# Patient Record
Sex: Male | Born: 1950 | Race: Black or African American | Hispanic: No | Smoking: Former smoker
Health system: Southern US, Community
[De-identification: ages and names within clinical notes are randomized; demographics above are authoritative.]

## PROBLEM LIST (undated history)

## (undated) DIAGNOSIS — E7800 Pure hypercholesterolemia, unspecified: Secondary | ICD-10-CM

## (undated) DIAGNOSIS — I10 Essential (primary) hypertension: Secondary | ICD-10-CM

## (undated) HISTORY — PX: BACK SURGERY: SHX140

---

## 1999-11-27 ENCOUNTER — Ambulatory Visit (HOSPITAL_BASED_OUTPATIENT_CLINIC_OR_DEPARTMENT_OTHER): Admission: RE | Admit: 1999-11-27 | Discharge: 1999-11-27 | Payer: Self-pay | Admitting: Otolaryngology

## 2021-01-20 ENCOUNTER — Emergency Department (HOSPITAL_BASED_OUTPATIENT_CLINIC_OR_DEPARTMENT_OTHER)
Admission: EM | Admit: 2021-01-20 | Discharge: 2021-01-20 | Disposition: A | Discharge status: Home / Self Care | Payer: Medicare HMO | Attending: Emergency Medicine | Admitting: Emergency Medicine

## 2021-01-20 ENCOUNTER — Other Ambulatory Visit: Payer: Self-pay

## 2021-01-20 ENCOUNTER — Emergency Department (HOSPITAL_BASED_OUTPATIENT_CLINIC_OR_DEPARTMENT_OTHER): Payer: Medicare HMO

## 2021-01-20 ENCOUNTER — Encounter (HOSPITAL_BASED_OUTPATIENT_CLINIC_OR_DEPARTMENT_OTHER): Payer: Self-pay

## 2021-01-20 DIAGNOSIS — M79605 Pain in left leg: Secondary | ICD-10-CM | POA: Diagnosis not present

## 2021-01-20 DIAGNOSIS — R42 Dizziness and giddiness: Secondary | ICD-10-CM | POA: Diagnosis present

## 2021-01-20 DIAGNOSIS — I10 Essential (primary) hypertension: Secondary | ICD-10-CM | POA: Diagnosis not present

## 2021-01-20 DIAGNOSIS — F17210 Nicotine dependence, cigarettes, uncomplicated: Secondary | ICD-10-CM | POA: Insufficient documentation

## 2021-01-20 DIAGNOSIS — E860 Dehydration: Secondary | ICD-10-CM | POA: Insufficient documentation

## 2021-01-20 HISTORY — DX: Pure hypercholesterolemia, unspecified: E78.00

## 2021-01-20 HISTORY — DX: Essential (primary) hypertension: I10

## 2021-01-20 LAB — CBC WITH DIFFERENTIAL/PLATELET
Abs Immature Granulocytes: 0.04 10*3/uL (ref 0.00–0.07)
Basophils Absolute: 0.1 10*3/uL (ref 0.0–0.1)
Basophils Relative: 1 %
Eosinophils Absolute: 0.2 10*3/uL (ref 0.0–0.5)
Eosinophils Relative: 1 %
HCT: 42.1 % (ref 39.0–52.0)
Hemoglobin: 14 g/dL (ref 13.0–17.0)
Immature Granulocytes: 0 %
Lymphocytes Relative: 27 %
Lymphs Abs: 3.6 10*3/uL (ref 0.7–4.0)
MCH: 28.3 pg (ref 26.0–34.0)
MCHC: 33.3 g/dL (ref 30.0–36.0)
MCV: 85.1 fL (ref 80.0–100.0)
Monocytes Absolute: 0.9 10*3/uL (ref 0.1–1.0)
Monocytes Relative: 7 %
Neutro Abs: 8.5 10*3/uL — ABNORMAL HIGH (ref 1.7–7.7)
Neutrophils Relative %: 64 %
Platelets: 308 10*3/uL (ref 150–400)
RBC: 4.95 MIL/uL (ref 4.22–5.81)
RDW: 15.6 % — ABNORMAL HIGH (ref 11.5–15.5)
WBC: 13.4 10*3/uL — ABNORMAL HIGH (ref 4.0–10.5)
nRBC: 0 % (ref 0.0–0.2)

## 2021-01-20 LAB — BASIC METABOLIC PANEL
Anion gap: 6 (ref 5–15)
BUN: 10 mg/dL (ref 8–23)
CO2: 25 mmol/L (ref 22–32)
Calcium: 9.1 mg/dL (ref 8.9–10.3)
Chloride: 107 mmol/L (ref 98–111)
Creatinine, Ser: 1.08 mg/dL (ref 0.61–1.24)
GFR, Estimated: >60 mL/min (ref >60)
Glucose, Bld: 91 mg/dL (ref 70–99)
Potassium: 3.9 mmol/L (ref 3.5–5.1)
Sodium: 138 mmol/L (ref 135–145)

## 2021-01-20 LAB — PROTIME-INR
INR: 1 (ref 0.8–1.2)
Prothrombin Time: 13.1 seconds (ref 11.4–15.2)

## 2021-01-20 LAB — CBG MONITORING, ED: Glucose-Capillary: 85 mg/dL (ref 70–99)

## 2021-01-20 MED ORDER — SODIUM CHLORIDE 0.9 % IV BOLUS
1000.0000 mL | Freq: Once | INTRAVENOUS | Status: AC
  Administered 2021-01-20: 1000 mL via INTRAVENOUS

## 2021-01-20 NOTE — ED Provider Notes (Signed)
[facility] EMERGENCY DEPARTMENT Provider Note   CSN: [id number] Arrival date & time: 01/20/21  1256     History Chief Complaint  Patient presents with   Dizziness    Mark Bird is a 70 y.o. male.  HPI Patient is a 70 year old male with past medical history of HLD, HTN, stroke, chronic back pain and s/p back surgery.  Patient is presented today with daughter at bedside.  He is complaining of symptoms of lightheadedness.  He denies any vertiginous symptoms.  States that he feels his symptoms primarily when he stands up.  States that he felt all day yesterday as well as all day today.  Denies any chest pain, headache, cough congestion fevers chills belly pain nausea or vomiting.  No diarrhea.  He states that he was recently out of his amlodipine for 3 days and restarted yesterday around the time when he started feeling weak.  He also states that on Thursday/2 days ago he did some outdoor work/moving for his daughter and may have overexerted himself.  He denies any weakness or numbness in any extremity.  He states he has some left leg pain but he states that he always has that pain.  He denies any new symptoms besides the lightheadedness and states he has taken no new medications no recreational drug use no alcohol.      Past Medical History:  Diagnosis Date   High cholesterol    Hypertension     There are no problems to display for this patient.   Past Surgical History:  Procedure Laterality Date   BACK SURGERY         History reviewed. No pertinent family history.  Social History   Tobacco Use   Smoking status: Some Days    Pack years: 0.00    Types: Cigarettes   Smokeless tobacco: Never  Substance Use Topics   Alcohol use: Not Currently   Drug use: Not Currently    Home Medications Prior to Admission medications   Not on File    Allergies    Patient has no known allergies.  Review of Systems   Review of Systems  Constitutional:  Negative  for chills and fever.  HENT:  Negative for congestion.   Eyes:  Negative for pain.  Respiratory:  Negative for cough and shortness of breath.   Cardiovascular:  Negative for chest pain and leg swelling.  Gastrointestinal:  Negative for abdominal distention, abdominal pain, diarrhea, nausea and vomiting.  Genitourinary:  Negative for dysuria.  Musculoskeletal:  Negative for myalgias.  Skin:  Negative for rash.  Neurological:  Positive for light-headedness. Negative for dizziness and headaches.   Physical Exam Updated Vital Signs BP 133/80   Pulse 73   Temp 98.7 F (37.1 C) (Oral)   Resp 12   Ht 5\' 8"  (1.727 m)   Wt 65.8 kg   SpO2 100%   BMI 22.05 kg/m   Physical Exam Vitals and nursing note reviewed.  Constitutional:      General: He is not in acute distress. HENT:     Head: Normocephalic and atraumatic.     Nose: Nose normal.     Mouth/Throat:     Mouth: Mucous membranes are dry.  Eyes:     General: No scleral icterus. Cardiovascular:     Rate and Rhythm: Normal rate and regular rhythm.     Pulses: Normal pulses.     Heart sounds: Normal heart sounds.  Pulmonary:     Effort: Pulmonary effort  is normal. No respiratory distress.     Breath sounds: No wheezing.  Abdominal:     Palpations: Abdomen is soft.     Tenderness: There is no abdominal tenderness.  Musculoskeletal:     Cervical back: Normal range of motion.     Right lower leg: No edema.     Left lower leg: No edema.  Skin:    General: Skin is warm and dry.     Capillary Refill: Capillary refill takes less than 2 seconds.  Neurological:     Mental Status: He is alert. Mental status is at baseline.     Comments: Alert and oriented to self, place, time and event.   Speech is fluent, clear without dysarthria or dysphasia.   Strength 5/5 in upper/lower extremities   Sensation intact in upper/lower extremities   Normal gait although hesitant  Normal finger-to-nose and feet tapping.  CN I not tested  CN  II grossly intact visual fields bilaterally. Did not visualize posterior eye.  CN III, IV, VI PERRLA and EOMs intact bilaterally  CN V Intact sensation to sharp and light touch to the face  CN VII facial movements symmetric  CN VIII not tested  CN IX, X no uvula deviation, symmetric rise of soft palate  CN XI 5/5 SCM and trapezius strength bilaterally  CN XII Midline tongue protrusion, symmetric L/R movements    Psychiatric:        Mood and Affect: Mood normal.        Behavior: Behavior normal.    ED Results / Procedures / Treatments   Labs (all labs ordered are listed, but only abnormal results are displayed) Labs Reviewed  CBC WITH DIFFERENTIAL/PLATELET - Abnormal; Notable for the following components:      Result Value   WBC 13.4 (*)    RDW 15.6 (*)    Neutro Abs 8.5 (*)    All other components within normal limits  BASIC METABOLIC PANEL  PROTIME-INR  CBG MONITORING, ED    EKG EKG Interpretation  Date/Time:  Saturday January 20 2021 13:11:18 EDT Ventricular Rate:  98 PR Interval:  160 QRS Duration: 72 QT Interval:  332 QTC Calculation: 423 R Axis:   86 Text Interpretation: Normal sinus rhythm Right atrial enlargement Borderline ECG Confirmed by Virgina Norfolk ([id number]) on 01/20/2021 1:13:42 PM  Radiology CT Head Wo Contrast  Result Date: 01/20/2021 CLINICAL DATA:  Dizziness, nonspecific; 2 days of vertigo intermittently, no head trauma, doubt central process. Additional history provided: History of stroke 3 years ago. EXAM: CT HEAD WITHOUT CONTRAST TECHNIQUE: Contiguous axial images were obtained from the base of the skull through the vertex without intravenous contrast. COMPARISON:  Brain MRI 07/13/2015. FINDINGS: Brain: Mild generalized cerebral atrophy. Mild patchy and ill-defined hypoattenuation within the cerebral white matter, nonspecific but compatible with chronic small vessel ischemic disease. Known chronic lacunar infarct within the left thalamus, better appreciated  on the brain MRI of 07/13/2015. There is no acute intracranial hemorrhage. No demarcated cortical infarct. No extra-axial fluid collection. No evidence of intracranial mass. No midline shift. Vascular: No hyperdense vessel.  Atherosclerotic calcifications. Skull: Normal. Negative for fracture or focal lesion. Sinuses/Orbits: Visualized orbits show no acute finding. Trace left ethmoid sinus mucosal thickening. IMPRESSION: No evidence of acute intracranial abnormality. Mild cerebral atrophy and chronic small vessel ischemic disease. Known chronic lacunar infarct within the left thalamus, better appreciated on the brain MRI of 07/13/2015. Electronically Signed   By: Jackey Loge DO   On: 01/20/2021 14:49  DG Chest Port 1 View  Result Date: 01/20/2021 CLINICAL DATA:  Chest pain. Additional provided: Low blood pressure and dizziness since waking this morning. EXAM: PORTABLE CHEST 1 VIEW COMPARISON:  Report from chest radiographs 08/18/2013 (images unavailable). FINDINGS: Heart size within normal limits. No appreciable airspace consolidation. No evidence of pleural effusion or pneumothorax. No acute bony abnormality identified. IMPRESSION: No evidence of active cardiopulmonary disease. Electronically Signed   By: Jackey Loge DO   On: 01/20/2021 14:50    Procedures Procedures   Medications Ordered in ED Medications  sodium chloride 0.9 % bolus 1,000 mL (0 mLs Intravenous Stopped 01/20/21 1522)    ED Course  I have reviewed the triage vital signs and the nursing notes.  Pertinent labs & imaging results that were available during my care of the patient were reviewed by me and considered in my medical decision making (see chart for details).  Patient is well-appearing 70 year old male dry oral mucosa here for lightheadedness overexerted himself 2 days ago and has been somewhat lightheaded since also restart his blood pressure medication amlodipine.  Patient has negative orthostatic vital signs feels much  improved after 1 L normal saline is neurologically intact CT scan of head unchanged from prior.  Clinical Course as of 01/20/21 1609  Sat Jan 20, 2021  1507 CBC with mild WBC elevation and patient is afebrile no tachycardia on my examination apart from triage vitals which were after patient walked in from 100 degree weather.  INR within normal limits.  BMP unremarkable.  CBC unremarkable.  EKG without any significant changes reviewed by Dr. Lockie Mola.  Chest x-ray without infiltrate or mediastinal widening CT scan with chronic lacunar infarct no other abnormalities. [WF]    Clinical Course User Index [WF] Gailen Shelter, [place]   MDM Rules/Calculators/A&P                          I discussed this case with my attending physician who cosigned this note including patient's presenting symptoms, physical exam, and planned diagnostics and interventions. Attending physician stated agreement with plan or made changes to plan which were implemented.   Discussed with patient's daughter again.  Both patient and daughter are agreeable to discharge home at this time.  Patient states he feels better.  Ambulated to the bathroom back-and-forth by himself without difficulty.  Final Clinical Impression(s) / ED Diagnoses Final diagnoses:  Dehydration  Lightheadedness    Rx / DC Orders ED Discharge Orders     None        Gailen Shelter, [place] 01/20/21 1609    Virgina Norfolk, DO 01/21/21 0800

## 2021-01-20 NOTE — ED Notes (Signed)
Ambulation in room - Steady gate, PT reports feeling dizzy when walking.

## 2021-01-20 NOTE — Discharge Instructions (Addendum)
Please follow-up with your primary care provider Monday or Tuesday to review your symptoms and how you feel after today's visit.  I would recommend keeping a close eye on your symptoms and return to ER for any new or worsening symptoms.  Drink plenty of water you may also supplement with Pedialyte  If your blood pressure is below 130/80 tomorrow morning I would hold off on taking her blood pressure medication until after you have seen your primary care provider.

## 2021-01-20 NOTE — ED Triage Notes (Signed)
Reports low b/p and dizziness since awaking this morning.  Takes amlodipine.  Had similar episode one week ago

## 2021-01-20 NOTE — ED Notes (Signed)
Patient ambulated to bathroom, gait steady. 

## 2021-01-25 ENCOUNTER — Other Ambulatory Visit: Payer: Self-pay

## 2021-01-25 ENCOUNTER — Emergency Department (HOSPITAL_BASED_OUTPATIENT_CLINIC_OR_DEPARTMENT_OTHER): Payer: Medicare HMO

## 2021-01-25 ENCOUNTER — Encounter (HOSPITAL_BASED_OUTPATIENT_CLINIC_OR_DEPARTMENT_OTHER): Payer: Self-pay | Admitting: Emergency Medicine

## 2021-01-25 ENCOUNTER — Observation Stay (HOSPITAL_BASED_OUTPATIENT_CLINIC_OR_DEPARTMENT_OTHER)
Admission: EM | Admit: 2021-01-25 | Discharge: 2021-01-26 | Disposition: A | Discharge status: Home / Self Care | Payer: Medicare HMO | Attending: General Surgery | Admitting: General Surgery

## 2021-01-25 DIAGNOSIS — R109 Unspecified abdominal pain: Secondary | ICD-10-CM | POA: Diagnosis not present

## 2021-01-25 DIAGNOSIS — Z20822 Contact with and (suspected) exposure to covid-19: Secondary | ICD-10-CM | POA: Diagnosis not present

## 2021-01-25 DIAGNOSIS — J449 Chronic obstructive pulmonary disease, unspecified: Secondary | ICD-10-CM | POA: Insufficient documentation

## 2021-01-25 DIAGNOSIS — I10 Essential (primary) hypertension: Secondary | ICD-10-CM | POA: Diagnosis not present

## 2021-01-25 DIAGNOSIS — W182XXA Fall in (into) shower or empty bathtub, initial encounter: Secondary | ICD-10-CM | POA: Insufficient documentation

## 2021-01-25 DIAGNOSIS — J942 Hemothorax: Secondary | ICD-10-CM

## 2021-01-25 DIAGNOSIS — F17210 Nicotine dependence, cigarettes, uncomplicated: Secondary | ICD-10-CM | POA: Diagnosis not present

## 2021-01-25 DIAGNOSIS — S2249XA Multiple fractures of ribs, unspecified side, initial encounter for closed fracture: Secondary | ICD-10-CM | POA: Diagnosis present

## 2021-01-25 DIAGNOSIS — S2242XA Multiple fractures of ribs, left side, initial encounter for closed fracture: Principal | ICD-10-CM | POA: Insufficient documentation

## 2021-01-25 DIAGNOSIS — W19XXXA Unspecified fall, initial encounter: Secondary | ICD-10-CM

## 2021-01-25 DIAGNOSIS — Y92009 Unspecified place in unspecified non-institutional (private) residence as the place of occurrence of the external cause: Secondary | ICD-10-CM | POA: Insufficient documentation

## 2021-01-25 DIAGNOSIS — S3991XA Unspecified injury of abdomen, initial encounter: Secondary | ICD-10-CM

## 2021-01-25 DIAGNOSIS — S299XXA Unspecified injury of thorax, initial encounter: Secondary | ICD-10-CM | POA: Diagnosis present

## 2021-01-25 LAB — URINALYSIS, MICROSCOPIC (REFLEX)

## 2021-01-25 LAB — URINALYSIS, ROUTINE W REFLEX MICROSCOPIC
Bilirubin Urine: NEGATIVE
Glucose, UA: NEGATIVE mg/dL
Ketones, ur: NEGATIVE mg/dL
Leukocytes,Ua: NEGATIVE
Nitrite: NEGATIVE
Protein, ur: NEGATIVE mg/dL
Specific Gravity, Urine: 1.01 (ref 1.005–1.030)
pH: 7 (ref 5.0–8.0)

## 2021-01-25 LAB — BASIC METABOLIC PANEL
Anion gap: 7 (ref 5–15)
BUN: 11 mg/dL (ref 8–23)
CO2: 26 mmol/L (ref 22–32)
Calcium: 9.3 mg/dL (ref 8.9–10.3)
Chloride: 104 mmol/L (ref 98–111)
Creatinine, Ser: 1.16 mg/dL (ref 0.61–1.24)
GFR, Estimated: >60 mL/min (ref >60)
Glucose, Bld: 109 mg/dL — ABNORMAL HIGH (ref 70–99)
Potassium: 3.9 mmol/L (ref 3.5–5.1)
Sodium: 137 mmol/L (ref 135–145)

## 2021-01-25 LAB — RESP PANEL BY RT-PCR (FLU A&B, COVID) ARPGX2
Influenza A by PCR: NEGATIVE
Influenza B by PCR: NEGATIVE
SARS Coronavirus 2 by RT PCR: NEGATIVE

## 2021-01-25 MED ORDER — HYDROCODONE-ACETAMINOPHEN 5-325 MG PO TABS
1.0000 | ORAL_TABLET | Freq: Once | ORAL | Status: AC
  Administered 2021-01-25: 1 via ORAL
  Filled 2021-01-25: qty 1

## 2021-01-25 MED ORDER — HYDROMORPHONE HCL 1 MG/ML IJ SOLN
1.0000 mg | INTRAMUSCULAR | Status: DC | PRN
Start: 2021-01-25 — End: 2021-01-26

## 2021-01-25 MED ORDER — ACETAMINOPHEN 325 MG PO TABS
650.0000 mg | ORAL_TABLET | Freq: Once | ORAL | Status: AC
  Administered 2021-01-25: 650 mg via ORAL
  Filled 2021-01-25: qty 2

## 2021-01-25 MED ORDER — HYDROMORPHONE HCL 1 MG/ML IJ SOLN: 0.5000 mg | INTRAMUSCULAR | Status: DC | PRN

## 2021-01-25 MED ORDER — OXYCODONE HCL 5 MG PO TABS
5.0000 mg | ORAL_TABLET | ORAL | Status: DC | PRN
  Administered 2021-01-26: 5 mg via ORAL
  Filled 2021-01-25: qty 1

## 2021-01-25 MED ORDER — ENOXAPARIN SODIUM 30 MG/0.3ML IJ SOSY
30.0000 mg | PREFILLED_SYRINGE | Freq: Two times a day (BID) | INTRAMUSCULAR | Status: DC
  Filled 2021-01-25: qty 0.3

## 2021-01-25 MED ORDER — HYDROCODONE-ACETAMINOPHEN 5-325 MG PO TABS: 1.0000 | ORAL_TABLET | ORAL | 0 refills | Status: AC | PRN

## 2021-01-25 MED ORDER — IOHEXOL 300 MG/ML  SOLN
100.0000 mL | Freq: Once | INTRAMUSCULAR | Status: AC | PRN
  Administered 2021-01-25: 100 mL via INTRAVENOUS

## 2021-01-25 MED ORDER — LACTATED RINGERS IV SOLN: INTRAVENOUS | Status: DC

## 2021-01-25 MED ORDER — OXYCODONE HCL 5 MG PO TABS
10.0000 mg | ORAL_TABLET | ORAL | Status: DC | PRN
Start: 2021-01-25 — End: 2021-01-26
  Administered 2021-01-25: 10 mg via ORAL
  Filled 2021-01-25: qty 2

## 2021-01-25 MED ORDER — IPRATROPIUM-ALBUTEROL 0.5-2.5 (3) MG/3ML IN SOLN: 3.0000 mL | Freq: Four times a day (QID) | RESPIRATORY_TRACT | Status: DC | PRN

## 2021-01-25 MED ORDER — ACETAMINOPHEN 325 MG PO TABS
650.0000 mg | ORAL_TABLET | Freq: Four times a day (QID) | ORAL | Status: DC
  Administered 2021-01-25 – 2021-01-26 (×5): 650 mg via ORAL
  Filled 2021-01-25 (×5): qty 2

## 2021-01-25 MED ORDER — METHOCARBAMOL 1000 MG/10ML IJ SOLN
1000.0000 mg | Freq: Three times a day (TID) | INTRAVENOUS | Status: DC
  Administered 2021-01-25 – 2021-01-26 (×4): 1000 mg via INTRAVENOUS
  Filled 2021-01-25 (×6): qty 10

## 2021-01-25 MED ORDER — ONDANSETRON 4 MG PO TBDP: 4.0000 mg | ORAL_TABLET | Freq: Four times a day (QID) | ORAL | Status: DC | PRN

## 2021-01-25 MED ORDER — ONDANSETRON HCL 4 MG/2ML IJ SOLN: 4.0000 mg | Freq: Four times a day (QID) | INTRAMUSCULAR | Status: DC | PRN

## 2021-01-25 NOTE — ED Notes (Signed)
Report given to Tommy RN with [facility]  ?

## 2021-01-25 NOTE — ED Notes (Signed)
Kept cell phone, jeans, shirt, baseball hat

## 2021-01-25 NOTE — Discharge Instructions (Addendum)
Taking deep breaths.  Do not get pneumonia.  Take the pain medication as prescribed.  Not take the pain medication if you are working or driving. Follow-up with your doctor for recheck this week.  Return to the ED with difficulty breathing, worsening pain, any other concerns.

## 2021-01-25 NOTE — ED Provider Notes (Signed)
[facility] EMERGENCY DEPARTMENT Provider Note   CSN: [id number] Arrival date & time: 01/25/21  [id number]     History Chief Complaint  Patient presents with   Fall   Flank Pain    Mark Bird is a 70 y.o. male.  Patient states he slipped and fell in the shower about 10 PM while washing his feet.  He lost his balance and hit his left side on the edge of the tub.  Denies hitting his head or losing consciousness.  States slip and fall.  No preceding dizziness or lightheadedness.  No headache, neck pain, back pain or chest pain.  Complains of left lateral lower rib pain.  Did not take any pain medication at home.  No blood thinner use. Not short of breath but hurts to breathe in. No focal weakness, numbness or tingling.  No dizziness or lightheadedness.  The history is provided by the patient.  Fall Associated symptoms include chest pain. Pertinent negatives include no abdominal pain, no headaches and no shortness of breath.  Flank Pain Associated symptoms include chest pain. Pertinent negatives include no abdominal pain, no headaches and no shortness of breath.      Past Medical History:  Diagnosis Date   High cholesterol    Hypertension     There are no problems to display for this patient.   Past Surgical History:  Procedure Laterality Date   BACK SURGERY         History reviewed. No pertinent family history.  Social History   Tobacco Use   Smoking status: Some Days    Pack years: 0.00    Types: Cigarettes   Smokeless tobacco: Never  Substance Use Topics   Alcohol use: Not Currently   Drug use: Not Currently    Home Medications Prior to Admission medications   Not on File    Allergies    Patient has no known allergies.  Review of Systems   Review of Systems  Constitutional:  Negative for activity change, appetite change, fatigue and fever.  HENT:  Negative for congestion and rhinorrhea.   Respiratory:  Negative for cough, chest tightness and  shortness of breath.   Cardiovascular:  Positive for chest pain.  Gastrointestinal:  Negative for abdominal pain, nausea and vomiting.  Genitourinary:  Positive for flank pain.  Musculoskeletal:  Negative for arthralgias, back pain and myalgias.  Skin:  Negative for rash.  Neurological:  Negative for dizziness, weakness and headaches.   all other systems are negative except as noted in the HPI and PMH.   Physical Exam Updated Vital Signs BP (!) 149/82 (BP Location: Left Arm)   Pulse 86   Temp 98.1 F (36.7 C) (Oral)   Resp 20   Wt 63 kg   SpO2 100%   BMI 21.12 kg/m   Physical Exam Vitals and nursing note reviewed.  Constitutional:      General: He is not in acute distress.    Appearance: He is well-developed.  HENT:     Head: Normocephalic and atraumatic.     Mouth/Throat:     Pharynx: No oropharyngeal exudate.  Eyes:     Conjunctiva/sclera: Conjunctivae normal.     Pupils: Pupils are equal, round, and reactive to light.  Neck:     Comments: No C spine tenderness Cardiovascular:     Rate and Rhythm: Normal rate and regular rhythm.     Heart sounds: Normal heart sounds. No murmur heard. Pulmonary:     Effort: Pulmonary effort  is normal. No respiratory distress.     Breath sounds: Normal breath sounds.     Comments: Tenderness to left lateral lower ribs.  No ecchymosis or crepitus Chest:     Chest wall: Tenderness present.  Abdominal:     Palpations: Abdomen is soft.     Tenderness: There is no abdominal tenderness. There is no guarding or rebound.     Comments: Tenderness to left lateral ribs, no ecchymosis or crepitus. Abdomen soft and nontender  Musculoskeletal:        General: No tenderness. Normal range of motion.     Cervical back: Normal range of motion and neck supple.  Skin:    General: Skin is warm.  Neurological:     Mental Status: He is alert and oriented to person, place, and time.     Cranial Nerves: No cranial nerve deficit.     Motor: No abnormal  muscle tone.     Coordination: Coordination normal.     Comments: No ataxia on finger to nose bilaterally. No pronator drift. 5/5 strength throughout. CN 2-12 intact.Equal grip strength. Sensation intact.   Psychiatric:        Behavior: Behavior normal.    ED Results / Procedures / Treatments   Labs (all labs ordered are listed, but only abnormal results are displayed) Labs Reviewed  URINALYSIS, ROUTINE W REFLEX MICROSCOPIC - Abnormal; Notable for the following components:      Result Value   Hgb urine dipstick TRACE (*)    All other components within normal limits  BASIC METABOLIC PANEL - Abnormal; Notable for the following components:   Glucose, Bld 109 (*)    All other components within normal limits  URINALYSIS, MICROSCOPIC (REFLEX) - Abnormal; Notable for the following components:   Bacteria, UA RARE (*)    All other components within normal limits    EKG None  Radiology DG Ribs Unilateral W/Chest Left  Result Date: 01/25/2021 CLINICAL DATA:  Fall, rib pain EXAM: LEFT RIBS AND CHEST - 3+ VIEW COMPARISON:  None. FINDINGS: Single view radiograph of the chest and two view radiograph of the left ribs demonstrates acute, minimally displaced fractures of the left ninth and tenth ribs laterally. Mild left basilar atelectasis with slight elevation of the left hemidiaphragm. Lungs are otherwise clear. No pneumothorax or pleural effusion. Cardiac size within normal limits. IMPRESSION: Acute fractures of the left 9, 10 ribs.  No pneumothorax. Electronically Signed   By: Helyn Numbers MD   On: 01/25/2021 01:19   CT CHEST ABDOMEN PELVIS W CONTRAST  Result Date: 01/25/2021 CLINICAL DATA:  Status post fall. History of back surgery. Pain left side EXAM: CT CHEST, ABDOMEN, AND PELVIS WITH CONTRAST TECHNIQUE: Multidetector CT imaging of the chest, abdomen and pelvis was performed following the standard protocol during bolus administration of intravenous contrast. CONTRAST:  OMNIPAQUE  IOHEXOL 300 MG/ML  SOLN COMPARISON:  None. FINDINGS: CHEST: Ports and Devices: None. Lungs/airways: At least moderate paraseptal and centrilobular emphysematous changes. Biapical pleural/pulmonary scarring. Bilateral lower lobe subsegmental atelectasis. No focal consolidation. No pulmonary nodule. No pulmonary mass. No pulmonary contusion or laceration. No pneumatocele formation. The central airways are patent. Pleura: No pleural effusion. Trace left pleural fluid. No right pleural effusion or hemothorax. Lymph Nodes: No mediastinal, hilar, or axillary lymphadenopathy. Mediastinum: No pneumomediastinum. No aortic injury or mediastinal hematoma. The thoracic aorta is normal in caliber. Atherosclerotic plaque of the origin of the left subclavian artery. The heart is normal in size. No significant pericardial effusion.  The main pulmonary artery is normal in caliber. No central pulmonary embolus. The esophagus is unremarkable. The thyroid is unremarkable. Chest Wall / Breasts: No chest wall mass. Musculoskeletal: Acute comminuted and displaced left posterior eleventh rib fracture. Acute minimally displaced left posterior and posterolateral tenth rib fracture broken in 2 different places. Acute minimally displaced left lateral ninth rib fracture. Acute nondisplaced lateral twelfth rib fracture. No sternal fracture. No spinal fracture. ABDOMEN / PELVIS: Liver: Not enlarged. No focal lesion. No laceration or subcapsular hematoma. Biliary System: The gallbladder is otherwise unremarkable with no radio-opaque gallstones. No biliary ductal dilatation. Pancreas: Normal pancreatic contour. No main pancreatic duct dilatation. Spleen: Not enlarged. No focal lesion. No laceration, subcapsular hematoma, or vascular injury. Adrenal Glands: No right adrenal gland nodularity. There is a heterogeneous 1.6 cm left adrenal gland nodule with a density of 41 Hounsfield units. Kidneys: Bilateral kidneys enhance symmetrically. Several  bilateral fluid density lesions likely represent simple renal cysts. Subcentimeter hypodensities too small to characterize. No hydronephrosis. No contusion, laceration, or subcapsular hematoma. No injury to the vascular structures or collecting systems. No hydroureter. The urinary bladder is unremarkable. On delayed imaging, there is no urothelial wall thickening and there are no filling defects in the opacified portions of the bilateral collecting systems or ureters. Bowel: No small or large bowel wall thickening or dilatation. The appendix is unremarkable. Mesentery, Omentum, and Peritoneum: No simple free fluid ascites. No pneumoperitoneum. No hemoperitoneum. No mesenteric hematoma identified. No organized fluid collection. Pelvic Organs: Normal. Lymph Nodes: No abdominal, pelvic, inguinal lymphadenopathy. Vasculature: Least moderate calcified and noncalcified atherosclerotic plaque. 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 No abdominal aorta or iliac aneurysm. No active contrast extravasation or pseudoaneurysm. Musculoskeletal: No significant soft tissue hematoma. No acute pelvic fracture. No spinal fracture. IMPRESSION: 1. Trace left pleural fluid - hemothorax not excluded. 2. Tiny foci of left apical gas likely represents paraseptal emphysematous changes/scarring versus less likely a tiny left apical pneumothorax. 3. Acute left 9-12 rib fractures with the 11th rib comminuted and displaced and the 10th rib fractured in two different places. 4. No acute traumatic injury to the abdomen or pelvis. 5. No acute fracture or traumatic malalignment of the thoracic or lumbar spine. 6. Aortic Atherosclerosis (ICD10-I70.0) and Emphysema (ICD10-J43.9). Other imaging findings of potential clinical significance: 1. Indeterminate 1.6 cm left adrenal gland nodule. Recommend CT adrenal protocol in 1 year for further evaluation. 2. Aortic Atherosclerosis (ICD10-I70.0). 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 3.  Emphysema (ICD10-J43.9). Electronically Signed   By: Tish Frederickson M.D.   On: 01/25/2021 05:09    Procedures Procedures   Medications Ordered in ED Medications - No data to display  ED Course  I have reviewed the triage vital signs and the nursing notes.  Pertinent labs & imaging results that were available during my care of the patient were reviewed by me and considered in my medical decision making (see chart for details).    MDM Rules/Calculators/A&P                         Fall with left rib injury.  No head injury.  No blood thinner use  X-ray shows fractures of left ninth and 10th ribs.  No pneumothorax  Given location near kidney and spleen, will obtain CT imaging  No significant intra-abdominal injury noted on CT scan.  There is trace left pleural fluid, tiny  left apical gas emphysematous changes versus tiny left apical pneumothorax. Fractures rib 9-12 with comminution of 10th and 11th rib.  Patient remains in no distress.  No increased work of breathing.  Appears comfortable with O2 saturation 95%.  Findings discussed with Dr. Fredricka Bonine of trauma surgery.  She recommends observation admission given multiple rib fractures with trace hemopneumothorax and history of COPD. Dr. Fredricka Bonine states no need for hospitalist involvement. Patient agreeable to admission. Final Clinical Impression(s) / ED Diagnoses Final diagnoses:  Fall, initial encounter  Closed fracture of multiple ribs of left side, initial encounter    Rx / DC Orders ED Discharge Orders     None        Haiven Nardone, Jeannett Senior, MD 01/25/21 [phone]

## 2021-01-25 NOTE — H&P (Addendum)
Mark Bird is an 70 y.o. male.   Chief Complaint: fall with L rib pain HPI: 70yo M was taking a shower last night when he lost his balance and fell. He struck his L side. No LOC. He went to [facility] for eval. He was found to have L rib FX 9-12 with trace HTX and ?tiny PTX. He was accepted in transfer to [facility] for admission. He C/O L rib pain, worse with deep breath.  Past Medical History:  Diagnosis Date   High cholesterol    Hypertension     Past Surgical History:  Procedure Laterality Date   BACK SURGERY      History reviewed. No pertinent family history. Social History:  reports that he has been smoking cigarettes. He has never used smokeless tobacco. He reports previous alcohol use. He reports previous drug use.  Allergies: No Known Allergies  No medications prior to admission.    Results for orders placed or performed during the hospital encounter of 01/25/21 (from the past 48 hour(s))  Urinalysis, Routine w reflex microscopic Urine, Clean Catch     Status: Abnormal   Collection Time: 01/25/21 12:21 AM  Result Value Ref Range   Color, Urine YELLOW YELLOW   APPearance CLEAR CLEAR   Specific Gravity, Urine 1.010 1.005 - 1.030   pH 7.0 5.0 - 8.0   Glucose, UA NEGATIVE NEGATIVE mg/dL   Hgb urine dipstick TRACE (A) NEGATIVE   Bilirubin Urine NEGATIVE NEGATIVE   Ketones, ur NEGATIVE NEGATIVE mg/dL   Protein, ur NEGATIVE NEGATIVE mg/dL   Nitrite NEGATIVE NEGATIVE   Leukocytes,Ua NEGATIVE NEGATIVE    Comment: Performed at [facility], [address] [place], [place] [address]  Urinalysis, Microscopic (reflex)     Status: Abnormal   Collection Time: 01/25/21 12:21 AM  Result Value Ref Range   RBC / HPF 0-5 0 - 5 RBC/hpf   WBC, UA 0-5 0 - 5 WBC/hpf   Bacteria, UA RARE (A) NONE SEEN   Squamous Epithelial / LPF 0-5 0 - 5    Comment: Performed at [facility], [address] [place], [place] [address]  Basic metabolic panel     Status: Abnormal    Collection Time: 01/25/21  1:41 AM  Result Value Ref Range   Sodium 137 135 - 145 mmol/L   Potassium 3.9 3.5 - 5.1 mmol/L   Chloride 104 98 - 111 mmol/L   CO2 26 22 - 32 mmol/L   Glucose, Bld 109 (H) 70 - 99 mg/dL    Comment: Glucose reference range applies only to samples taken after fasting for at least 8 hours.   BUN 11 8 - 23 mg/dL   Creatinine, Ser 5.39 0.61 - 1.24 mg/dL   Calcium 9.3 8.9 - 76.7 mg/dL   GFR, Estimated >34 >19 mL/min    Comment: (NOTE) Calculated using the CKD-EPI Creatinine Equation (2021)    Anion gap 7 5 - 15    Comment: Performed at [facility], [address] [place], [place] [address]  Resp Panel by RT-PCR (Flu A&B, Covid) Nasopharyngeal Swab     Status: None   Collection Time: 01/25/21  6:44 AM   Specimen: Nasopharyngeal Swab; Nasopharyngeal(NP) swabs in vial transport medium  Result Value Ref Range   SARS Coronavirus 2 by RT PCR NEGATIVE NEGATIVE    Comment: (NOTE) SARS-CoV-2 target nucleic acids are NOT DETECTED.  The SARS-CoV-2 RNA is generally detectable in upper respiratory  specimens during the acute phase of infection. The lowest concentration of SARS-CoV-2 viral copies this assay can detect is 138 copies/mL. A negative result does not preclude SARS-Cov-2 infection and should not be used as the sole basis for treatment or other patient management decisions. A negative result may occur with  improper specimen collection/handling, submission of specimen other than nasopharyngeal swab, presence of viral mutation(s) within the areas targeted by this assay, and inadequate number of viral copies(<138 copies/mL). A negative result must be combined with clinical observations, patient history, and epidemiological information. The expected result is Negative.  Fact Sheet for Patients:  BloggerCourse.com  Fact Sheet for Healthcare Providers:  SeriousBroker.it  This test is no t yet  approved or cleared by the [place] FDA and  has been authorized for detection and/or diagnosis of SARS-CoV-2 by FDA under an Emergency Use Authorization (EUA). This EUA will remain  in effect (meaning this test can be used) for the duration of the COVID-19 declaration under Section 564(b)(1) of the Act, 21 U.S.C.section 360bbb-3(b)(1), unless the authorization is terminated  or revoked sooner.       Influenza A by PCR NEGATIVE NEGATIVE   Influenza B by PCR NEGATIVE NEGATIVE    Comment: (NOTE) The Xpert Xpress SARS-CoV-2/FLU/RSV plus assay is intended as an aid in the diagnosis of influenza from Nasopharyngeal swab specimens and should not be used as a sole basis for treatment. Nasal washings and aspirates are unacceptable for Xpert Xpress SARS-CoV-2/FLU/RSV testing.  Fact Sheet for Patients: BloggerCourse.com  Fact Sheet for Healthcare Providers: SeriousBroker.it  This test is not yet approved or cleared by the [place] FDA and has been authorized for detection and/or diagnosis of SARS-CoV-2 by FDA under an Emergency Use Authorization (EUA). This EUA will remain in effect (meaning this test can be used) for the duration of the COVID-19 declaration under Section 564(b)(1) of the Act, 21 U.S.C. section 360bbb-3(b)(1), unless the authorization is terminated or revoked.  Performed at [facility], [address], [place], [place] [address]    DG Ribs Unilateral W/Chest Left  Result Date: 01/25/2021 CLINICAL DATA:  Fall, rib pain EXAM: LEFT RIBS AND CHEST - 3+ VIEW COMPARISON:  None. FINDINGS: Single view radiograph of the chest and two view radiograph of the left ribs demonstrates acute, minimally displaced fractures of the left ninth and tenth ribs laterally. Mild left basilar atelectasis with slight elevation of the left hemidiaphragm. Lungs are otherwise clear. No pneumothorax or pleural effusion. Cardiac  size within normal limits. IMPRESSION: Acute fractures of the left 9, 10 ribs.  No pneumothorax. Electronically Signed   By: Helyn Numbers MD   On: 01/25/2021 01:19   CT CHEST ABDOMEN PELVIS W CONTRAST  Result Date: 01/25/2021 CLINICAL DATA:  Status post fall. History of back surgery. Pain left side EXAM: CT CHEST, ABDOMEN, AND PELVIS WITH CONTRAST TECHNIQUE: Multidetector CT imaging of the chest, abdomen and pelvis was performed following the standard protocol during bolus administration of intravenous contrast. CONTRAST:  OMNIPAQUE IOHEXOL 300 MG/ML  SOLN COMPARISON:  None. FINDINGS: CHEST: Ports and Devices: None. Lungs/airways: At least moderate paraseptal and centrilobular emphysematous changes. Biapical pleural/pulmonary scarring. Bilateral lower lobe subsegmental atelectasis. No focal consolidation. No pulmonary nodule. No pulmonary mass. No pulmonary contusion or laceration. No pneumatocele formation. The central airways are patent. Pleura: No pleural effusion. Trace left pleural fluid. No right pleural effusion or hemothorax. Lymph Nodes: No mediastinal, hilar, or axillary lymphadenopathy. Mediastinum: No pneumomediastinum. No aortic injury or  mediastinal hematoma. The thoracic aorta is normal in caliber. Atherosclerotic plaque of the origin of the left subclavian artery. The heart is normal in size. No significant pericardial effusion. The main pulmonary artery is normal in caliber. No central pulmonary embolus. The esophagus is unremarkable. The thyroid is unremarkable. Chest Wall / Breasts: No chest wall mass. Musculoskeletal: Acute comminuted and displaced left posterior eleventh rib fracture. Acute minimally displaced left posterior and posterolateral tenth rib fracture broken in 2 different places. Acute minimally displaced left lateral ninth rib fracture. Acute nondisplaced lateral twelfth rib fracture. No sternal fracture. No spinal fracture. ABDOMEN / PELVIS: Liver: Not enlarged. No  focal lesion. No laceration or subcapsular hematoma. Biliary System: The gallbladder is otherwise unremarkable with no radio-opaque gallstones. No biliary ductal dilatation. Pancreas: Normal pancreatic contour. No main pancreatic duct dilatation. Spleen: Not enlarged. No focal lesion. No laceration, subcapsular hematoma, or vascular injury. Adrenal Glands: No right adrenal gland nodularity. There is a heterogeneous 1.6 cm left adrenal gland nodule with a density of 41 Hounsfield units. Kidneys: Bilateral kidneys enhance symmetrically. Several bilateral fluid density lesions likely represent simple renal cysts. Subcentimeter hypodensities too small to characterize. No hydronephrosis. No contusion, laceration, or subcapsular hematoma. No injury to the vascular structures or collecting systems. No hydroureter. The urinary bladder is unremarkable. On delayed imaging, there is no urothelial wall thickening and there are no filling defects in the opacified portions of the bilateral collecting systems or ureters. Bowel: No small or large bowel wall thickening or dilatation. The appendix is unremarkable. Mesentery, Omentum, and Peritoneum: No simple free fluid ascites. No pneumoperitoneum. No hemoperitoneum. No mesenteric hematoma identified. No organized fluid collection. Pelvic Organs: Normal. Lymph Nodes: No abdominal, pelvic, inguinal lymphadenopathy. Vasculature: Least moderate calcified and noncalcified atherosclerotic plaque. 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 No abdominal aorta or iliac aneurysm. No active contrast extravasation or pseudoaneurysm. Musculoskeletal: No significant soft tissue hematoma. No acute pelvic fracture. No spinal fracture. IMPRESSION: 1. Trace left pleural fluid - hemothorax not excluded. 2. Tiny foci of left apical gas likely represents paraseptal emphysematous changes/scarring versus less likely a tiny left apical pneumothorax. 3.  Acute left 9-12 rib fractures with the 11th rib comminuted and displaced and the 10th rib fractured in two different places. 4. No acute traumatic injury to the abdomen or pelvis. 5. No acute fracture or traumatic malalignment of the thoracic or lumbar spine. 6. Aortic Atherosclerosis (ICD10-I70.0) and Emphysema (ICD10-J43.9). Other imaging findings of potential clinical significance: 1. Indeterminate 1.6 cm left adrenal gland nodule. Recommend CT adrenal protocol in 1 year for further evaluation. 2. Aortic Atherosclerosis (ICD10-I70.0). 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 3.  Emphysema (ICD10-J43.9). Electronically Signed   By: Tish FredericksonMorgane  Naveau M.D.   On: 01/25/2021 05:09    Review of Systems  Constitutional: Negative.   HENT: Negative.    Eyes: Negative.   Respiratory: Negative.  Negative for shortness of breath.   Cardiovascular:  Positive for chest pain.  Gastrointestinal:  Negative for abdominal pain.  Endocrine: Negative.   Genitourinary: Negative.   Musculoskeletal:  Positive for back pain.  Allergic/Immunologic: Negative.   Neurological: Negative.   Hematological: Negative.   Psychiatric/Behavioral: Negative.     Blood pressure 124/83, pulse (!) 59, temperature 97.7 F (36.5 C), temperature source Oral, resp. rate 16, weight 63 kg, SpO2 100 %. Physical Exam Constitutional:      Appearance: Normal appearance.  HENT:  Head: Normocephalic.     Right Ear: External ear normal.     Left Ear: External ear normal.     Mouth/Throat:     Mouth: Mucous membranes are moist.  Eyes:     General: No scleral icterus.    Pupils: Pupils are equal, round, and reactive to light.  Cardiovascular:     Rate and Rhythm: Normal rate and regular rhythm.     Pulses: Normal pulses.     Heart sounds: Normal heart sounds.  Pulmonary:     Effort: Pulmonary effort is normal.     Breath sounds: Normal breath sounds.     Comments: L rib  tenderness Chest:     Chest wall: Tenderness present.  Abdominal:     General: Abdomen is flat.     Palpations: Abdomen is soft. There is no mass.     Tenderness: There is no abdominal tenderness. There is no guarding or rebound.  Musculoskeletal:        General: No swelling or tenderness.     Cervical back: Neck supple. No tenderness.  Skin:    General: Skin is warm and dry.     Capillary Refill: Capillary refill takes 2 to 3 seconds.  Neurological:     Mental Status: He is alert and oriented to person, place, and time.     Comments: GCS 15  Psychiatric:        Mood and Affect: Mood normal.     Assessment/Plan Fall L rib FX 9-12 with trace HTX and ?tiny PTX - admit, multimodal pain control, pulmonary toilet. COPD - not on meds, duonebs PRN  Admit to inpatient.  Liz Malady, MD 01/25/2021, 11:30 AM

## 2021-01-25 NOTE — ED Notes (Signed)
Pt reports fall last night while getting out of shower. Hx cva. Denies deficits. No assistive devices used. C/o left rib pain. Shallow breathing.

## 2021-01-25 NOTE — Evaluation (Signed)
Physical Therapy Evaluation Patient Details Name: Mark Bird MRN: [id number] DOB: [birth year] Today's Date: 01/25/2021   History of Present Illness  The pt is a 70 yo male presenting 6/23 with c/o L side pain after a fall in his shower. Imaging reveals fx of L 9 and 10th ribs. PMH includes: CVA, HTN, and back surgery.   Clinical Impression  Pt in bed upon arrival of PT, agreeable to evaluation at this time. Prior to admission the pt was completely independent with mobility without need for AD, living alone in a home with level entry. The pt now presents with limitations in functional mobility, power, and dynamic stability, and will continue to benefit from skilled PT to address these deficits. The pt was able to demo good independence with bed mobility, but benefits from minG and single UE support at times to maintain stability with gait. The pt did still have x1 episode of lateral LOB taking multiple staggering steps and minA to recover. The pt was educated in multiple stair navigation strategies, and fall risk reduction strategies for home. Will continue to benefit from skilled PT acutely to further progress dynamic stability as well as HHPT to further reduce risk of falls.      Follow Up Recommendations Home health PT (for balance)    Equipment Recommendations  Cane (tub bench)    Recommendations for Other Services       Precautions / Restrictions Precautions Precautions: Fall Restrictions Weight Bearing Restrictions: No      Mobility  Bed Mobility Overal bed mobility: Modified Independent             General bed mobility comments: use of HOB elevated for pt comfort, but able to come to sitting EOB without any physical assist    Transfers Overall transfer level: Needs assistance Equipment used: None Transfers: Sit to/from Stand Sit to Stand: Min guard         General transfer comment: minG for safety, pt able to complete without VC or initial LOB, but was reaching  for UE support  Ambulation/Gait Ambulation/Gait assistance: Min guard Gait Distance (Feet): 150 Feet Assistive device: IV Pole;None Gait Pattern/deviations: Step-through pattern;Decreased stride length;Staggering right Gait velocity: 0.29 m/s Gait velocity interpretation: <1.31 ft/sec, indicative of household ambulator General Gait Details: one episode of stagering to L. pt with slow but mostly steady gait, increased lateral sway, prefers single UE support on IV pole, but able to ambulate without AD without LOB  Stairs Stairs: Yes Stairs assistance: Min guard Stair Management: One rail Right;Step to pattern;Sideways;Forwards Number of Stairs: 3 General stair comments: pt educated on forwards step-to and sideways step-to if he needed increased UE support. expressed understanding  Wheelchair Mobility    Modified Rankin (Stroke Patients Only)       Balance Overall balance assessment: Needs assistance Sitting-balance support: No upper extremity supported;Feet supported Sitting balance-Leahy Scale: Good     Standing balance support: Single extremity supported;During functional activity Standing balance-Leahy Scale: Fair Standing balance comment: able to ambulate without UE support, prefers Ue support for comfort Single Leg Stance - Right Leg: 3 Single Leg Stance - Left Leg: 8 Tandem Stance - Right Leg: 8 Tandem Stance - Left Leg: 11 Rhomberg - Eyes Opened: 15 Rhomberg - Eyes Closed: 15                 Pertinent Vitals/Pain Pain Assessment: Faces Faces Pain Scale: Hurts little more Pain Location: L side (ribs) Pain Descriptors / Indicators: Discomfort;Grimacing;Sore Pain Intervention(s): Limited activity within  patient's tolerance;Monitored during session;Repositioned    Home Living Family/patient expects to be discharged to:: Private residence Living Arrangements: Alone Available Help at Discharge: Family (pt plans to go live with his sister) Type of Home:  Apartment Home Access: Stairs to enter Entrance Stairs-Rails: Doctor, general practice of Steps: flight Home Layout: One level Home Equipment: None Additional Comments: pt home is level entry, one story with tub shower, states he plans to go to sister's 2nd floor apt which is described above    Prior Function Level of Independence: Independent         Comments: reports he is retired, but fully indpenedent, driving, all ADLs independently. no other falls     Hand Dominance   Dominant Hand: Left    Extremity/Trunk Assessment   Upper Extremity Assessment Upper Extremity Assessment: Overall WFL for tasks assessed    Lower Extremity Assessment Lower Extremity Assessment: Overall WFL for tasks assessed    Cervical / Trunk Assessment Cervical / Trunk Assessment: Normal  Communication   Communication: No difficulties  Cognition Arousal/Alertness: Awake/alert Behavior During Therapy: WFL for tasks assessed/performed Overall Cognitive Status: Within Functional Limits for tasks assessed                                        General Comments General comments (skin integrity, edema, etc.): VSS on RA    Exercises     Assessment/Plan    PT Assessment Patient needs continued PT services  PT Problem List Decreased balance;Decreased mobility;Decreased coordination       PT Treatment Interventions DME instruction;Gait training;Stair training;Functional mobility training;Therapeutic activities;Therapeutic exercise;Balance training;Patient/family education    PT Goals (Current goals can be found in the Care Plan section)  Acute Rehab PT Goals Patient Stated Goal: not fall again PT Goal Formulation: With patient Time For Goal Achievement: 02/08/21 Potential to Achieve Goals: Good    Frequency Min 3X/week    AM-PAC PT "6 Clicks" Mobility  Outcome Measure Help needed turning from your back to your side while in a flat bed without using bedrails?:  None Help needed moving from lying on your back to sitting on the side of a flat bed without using bedrails?: None Help needed moving to and from a bed to a chair (including a wheelchair)?: A Little Help needed standing up from a chair using your arms (e.g., wheelchair or bedside chair)?: A Little Help needed to walk in hospital room?: A Little Help needed climbing 3-5 steps with a railing? : A Little 6 Click Score: 20    End of Session Equipment Utilized During Treatment: Gait belt Activity Tolerance: Patient tolerated treatment well Patient left: in chair;with call bell/phone within reach;with chair alarm set Nurse Communication: Mobility status (pt daughter wanting an update) PT Visit Diagnosis: Other abnormalities of gait and mobility (R26.89);Unsteadiness on feet (R26.81)    Time: 6761-9509 PT Time Calculation (min) (ACUTE ONLY): 28 min   Charges:   PT Evaluation $PT Eval Low Complexity: 1 Low PT Treatments $Gait Training: 8-22 mins        Rolm Baptise, PT, DPT   Acute Rehabilitation Department Pager #: [phone]  Gaetana Michaelis 01/25/2021, 5:35 PM

## 2021-01-25 NOTE — ED Notes (Signed)
Patient denies pain and is resting comfortably. Medications offered. Plan of care explained to pt.

## 2021-01-25 NOTE — ED Triage Notes (Signed)
Pt fall in shower pain to left side. Denies loc or head injury.

## 2021-01-25 NOTE — Progress Notes (Signed)
Pt admitted to [facility] via [facility] from [facility]. Pt has glasses, needs assistance with ambulation. Orthostatics taken, pt has some dizziness. Will get some food ordered and give him some fluids.

## 2021-01-25 NOTE — ED Notes (Signed)
Report given to receiving nurse at [facility] RN

## 2021-01-26 ENCOUNTER — Inpatient Hospital Stay (HOSPITAL_COMMUNITY): Payer: Medicare HMO

## 2021-01-26 LAB — CBC
HCT: 43.8 % (ref 39.0–52.0)
Hemoglobin: 14.4 g/dL (ref 13.0–17.0)
MCH: 27.9 pg (ref 26.0–34.0)
MCHC: 32.9 g/dL (ref 30.0–36.0)
MCV: 84.9 fL (ref 80.0–100.0)
Platelets: 303 10*3/uL (ref 150–400)
RBC: 5.16 MIL/uL (ref 4.22–5.81)
RDW: 15.5 % (ref 11.5–15.5)
WBC: 8.3 10*3/uL (ref 4.0–10.5)
nRBC: 0 % (ref 0.0–0.2)

## 2021-01-26 LAB — BASIC METABOLIC PANEL
Anion gap: 10 (ref 5–15)
BUN: 6 mg/dL — ABNORMAL LOW (ref 8–23)
CO2: 23 mmol/L (ref 22–32)
Calcium: 9.1 mg/dL (ref 8.9–10.3)
Chloride: 102 mmol/L (ref 98–111)
Creatinine, Ser: 0.85 mg/dL (ref 0.61–1.24)
GFR, Estimated: >60 mL/min (ref >60)
Glucose, Bld: 82 mg/dL (ref 70–99)
Potassium: 3.7 mmol/L (ref 3.5–5.1)
Sodium: 135 mmol/L (ref 135–145)

## 2021-01-26 LAB — HIV ANTIBODY (ROUTINE TESTING W REFLEX): HIV Screen 4th Generation wRfx: NONREACTIVE

## 2021-01-26 MED ORDER — ACETAMINOPHEN 325 MG PO TABS: 650.0000 mg | ORAL_TABLET | Freq: Four times a day (QID) | ORAL | Status: AC | PRN

## 2021-01-26 MED ORDER — AMLODIPINE BESYLATE 10 MG PO TABS
10.0000 mg | ORAL_TABLET | Freq: Every day | ORAL | Status: DC
  Administered 2021-01-26: 10 mg via ORAL
  Filled 2021-01-26: qty 1

## 2021-01-26 MED ORDER — METHOCARBAMOL 500 MG PO TABS: 500.0000 mg | ORAL_TABLET | Freq: Three times a day (TID) | ORAL | 0 refills | Status: AC | PRN

## 2021-01-26 NOTE — Evaluation (Signed)
Occupational Therapy Evaluation Patient Details Name: Mark Bird MRN: [id number] DOB: 21-Jan-1951 Today's Date: 01/26/2021    History of Present Illness The pt is a 70 yo male presenting 6/23 with c/o L side pain after a fall in his shower. Imaging reveals fx of L 9 and 10th ribs. PMH includes: CVA, HTN, and back surgery.   Clinical Impression   Pt is at min guard A level with LB ADLs and mobility using no AD. PTA pt lived with his sister and was Ind with ADLs, IADLs, mobility. Pt hopeful to d/c home today.    Follow Up Recommendations  No OT follow up;Supervision - Intermittent    Equipment Recommendations  Tub/shower bench    Recommendations for Other Services       Precautions / Restrictions Precautions Precautions: Fall Restrictions Weight Bearing Restrictions: No      Mobility Bed Mobility Overal bed mobility: Modified Independent             General bed mobility comments: use of HOB elevated for pt comfort, but able to come to sitting EOB without any physical assist    Transfers Overall transfer level: Needs assistance Equipment used: None Transfers: Sit to/from Stand Sit to Stand: Min guard         General transfer comment: min guard A for safety    Balance Overall balance assessment: Needs assistance Sitting-balance support: No upper extremity supported;Feet supported Sitting balance-Leahy Scale: Good     Standing balance support: Single extremity supported;During functional activity Standing balance-Leahy Scale: Fair                             ADL either performed or assessed with clinical judgement   ADL Overall ADL's : Needs assistance/impaired Eating/Feeding: Independent   Grooming: Wash/dry hands;Wash/dry face;Supervision/safety;Standing   Upper Body Bathing: Independent   Lower Body Bathing: Min guard   Upper Body Dressing : Independent   Lower Body Dressing: Min guard   Toilet Transfer: Min  guard;Ambulation;Cueing for safety   Toileting- Clothing Manipulation and Hygiene: Supervision/safety   Tub/ Shower Transfer: 3 in 1;Walk-in shower;Min guard   Functional mobility during ADLs: Min guard;Cueing for safety General ADL Comments: min guard A for safety     Vision Baseline Vision/History: Wears glasses Wears Glasses: At all times Patient Visual Report: No change from baseline       Perception     Praxis      Pertinent Vitals/Pain Pain Assessment: 0-10 Pain Score: 4  Pain Location: L side (ribs) Pain Descriptors / Indicators: Discomfort;Sore Pain Intervention(s): Monitored during session;Premedicated before session;Repositioned     Hand Dominance Left   Extremity/Trunk Assessment Upper Extremity Assessment Upper Extremity Assessment: Overall WFL for tasks assessed   Lower Extremity Assessment Lower Extremity Assessment: Defer to PT evaluation   Cervical / Trunk Assessment Cervical / Trunk Assessment: Normal   Communication Communication Communication: No difficulties   Cognition Arousal/Alertness: Awake/alert Behavior During Therapy: WFL for tasks assessed/performed Overall Cognitive Status: Within Functional Limits for tasks assessed                                     General Comments       Exercises     Shoulder Instructions      Home Living Family/patient expects to be discharged to:: Private residence Living Arrangements: Alone Available Help at Discharge: Family Type of  Home: Apartment Home Access: Stairs to enter [employer] of Steps: flight Entrance Stairs-Rails: Right;Left Home Layout: One level     Bathroom Shower/Tub: Chief Strategy Officer: Standard     Home Equipment: None   Additional Comments: pt home is level entry, one story with tub shower, states he plans to go to sister's 2nd floor apt which is described above      Prior Functioning/Environment Level of Independence:  Independent        Comments: reports he is retired, but fully indpenedent, driving, all ADLs independently. no other falls        OT Problem List: Decreased activity tolerance;Pain      OT Treatment/Interventions:      OT Goals(Current goals can be found in the care plan section) Acute Rehab OT Goals Patient Stated Goal: go home, no falls OT Goal Formulation: With patient  OT Frequency:     Barriers to D/C:            Co-evaluation              AM-PAC OT "6 Clicks" Daily Activity     Outcome Measure Help from another person eating meals?: None Help from another person taking care of personal grooming?: None Help from another person toileting, which includes using toliet, bedpan, or urinal?: A Little Help from another person bathing (including washing, rinsing, drying)?: A Little Help from another person to put on and taking off regular upper body clothing?: None Help from another person to put on and taking off regular lower body clothing?: A Little 6 Click Score: 21   End of Session    Activity Tolerance: Patient tolerated treatment well Patient left: in chair;with call bell/phone within reach;with chair alarm set  OT Visit Diagnosis: History of falling (Z91.81);Pain;Other abnormalities of gait and mobility (R26.89) Pain - Right/Left: Left Pain - part of body:  (rib areas)                Time: 3903-0092 OT Time Calculation (min): 21 min Charges:  OT General Charges $OT Visit: 1 Visit OT Evaluation $OT Eval Low Complexity: 1 Low    Galen Manila 01/26/2021, 10:31 AM

## 2021-01-26 NOTE — Progress Notes (Signed)
Jackelyn Knife with [employer] and let her know the address of pt's sister to send equipment to.

## 2021-01-26 NOTE — Progress Notes (Signed)
Address of sister:  [address] [place], [place]  [address]  For equipment to be delivered to.

## 2021-01-26 NOTE — Progress Notes (Signed)
[facility] Progress Note     Subjective: CC:  States his pain is controlled. Pulling >1500 cc on IS. Having left posterior chest wall pain with movement. Tolerating PO. Denies urinary sxs. States he lives with his sister in [place] who is in good health.  AFVSS Objective: Vital signs in last 24 hours: Temp:  [97.5 F (36.4 C)-97.7 F (36.5 C)] 97.7 F (36.5 C) (06/24 0602) Pulse Rate:  [54-68] 68 (06/24 0602) Resp:  [14-17] 16 (06/24 0602) BP: (114-161)/(68-86) 114/68 (06/24 0602) SpO2:  [95 %-100 %] 98 % (06/24 0602) Last BM Date: 01/25/21  Intake/Output from previous day: 06/23 0701 - 06/24 0700 In: 1161.6 [P.O.:422; I.V.:448; IV Piggyback:291.6] Out: 1300 [Urine:1300] Intake/Output this shift: No intake/output data recorded.  PE: Gen:  Alert, NAD, pleasant Card:  Regular rate and rhythm, pedal pulses 2+ BL Pulm:  Normal effort, clear to auscultation bilaterally  Abd: Soft, non-tender, non-distended, bowel sounds present in all 4 quadrants, no HSM Skin: warm and dry, no rashes  Psych: A&Ox3   Lab Results:  Recent Labs    01/26/21 0234  WBC 8.3  HGB 14.4  HCT 43.8  PLT 303   BMET Recent Labs    01/25/21 0141 01/26/21 0234  NA 137 135  K 3.9 3.7  CL 104 102  CO2 26 23  GLUCOSE 109* 82  BUN 11 6*  CREATININE 1.16 0.85  CALCIUM 9.3 9.1   PT/INR No results for input(s): LABPROT, INR in the last 72 hours. CMP     Component Value Date/Time   NA 135 01/26/2021 0234   K 3.7 01/26/2021 0234   CL 102 01/26/2021 0234   CO2 23 01/26/2021 0234   GLUCOSE 82 01/26/2021 0234   BUN 6 (L) 01/26/2021 0234   CREATININE 0.85 01/26/2021 0234   CALCIUM 9.1 01/26/2021 0234   GFRNONAA >60 01/26/2021 0234   Lipase  No results found for: LIPASE     Studies/Results: DG Ribs Unilateral W/Chest Left  Result Date: 01/25/2021 CLINICAL DATA:  Fall, rib pain EXAM: LEFT RIBS AND CHEST - 3+ VIEW COMPARISON:  None. FINDINGS: Single view radiograph of  the chest and two view radiograph of the left ribs demonstrates acute, minimally displaced fractures of the left ninth and tenth ribs laterally. Mild left basilar atelectasis with slight elevation of the left hemidiaphragm. Lungs are otherwise clear. No pneumothorax or pleural effusion. Cardiac size within normal limits. IMPRESSION: Acute fractures of the left 9, 10 ribs.  No pneumothorax. Electronically Signed   By: Helyn Numbers MD   On: 01/25/2021 01:19   CT CHEST ABDOMEN PELVIS W CONTRAST  Result Date: 01/25/2021 CLINICAL DATA:  Status post fall. History of back surgery. Pain left side EXAM: CT CHEST, ABDOMEN, AND PELVIS WITH CONTRAST TECHNIQUE: Multidetector CT imaging of the chest, abdomen and pelvis was performed following the standard protocol during bolus administration of intravenous contrast. CONTRAST:  OMNIPAQUE IOHEXOL 300 MG/ML  SOLN COMPARISON:  None. FINDINGS: CHEST: Ports and Devices: None. Lungs/airways: At least moderate paraseptal and centrilobular emphysematous changes. Biapical pleural/pulmonary scarring. Bilateral lower lobe subsegmental atelectasis. No focal consolidation. No pulmonary nodule. No pulmonary mass. No pulmonary contusion or laceration. No pneumatocele formation. The central airways are patent. Pleura: No pleural effusion. Trace left pleural fluid. No right pleural effusion or hemothorax. Lymph Nodes: No mediastinal, hilar, or axillary lymphadenopathy. Mediastinum: No pneumomediastinum. No aortic injury or mediastinal hematoma. The thoracic aorta is normal in caliber. Atherosclerotic plaque of the origin of the  left subclavian artery. The heart is normal in size. No significant pericardial effusion. The main pulmonary artery is normal in caliber. No central pulmonary embolus. The esophagus is unremarkable. The thyroid is unremarkable. Chest Wall / Breasts: No chest wall mass. Musculoskeletal: Acute comminuted and displaced left posterior eleventh rib fracture. Acute  minimally displaced left posterior and posterolateral tenth rib fracture broken in 2 different places. Acute minimally displaced left lateral ninth rib fracture. Acute nondisplaced lateral twelfth rib fracture. No sternal fracture. No spinal fracture. ABDOMEN / PELVIS: Liver: Not enlarged. No focal lesion. No laceration or subcapsular hematoma. Biliary System: The gallbladder is otherwise unremarkable with no radio-opaque gallstones. No biliary ductal dilatation. Pancreas: Normal pancreatic contour. No main pancreatic duct dilatation. Spleen: Not enlarged. No focal lesion. No laceration, subcapsular hematoma, or vascular injury. Adrenal Glands: No right adrenal gland nodularity. There is a heterogeneous 1.6 cm left adrenal gland nodule with a density of 41 Hounsfield units. Kidneys: Bilateral kidneys enhance symmetrically. Several bilateral fluid density lesions likely represent simple renal cysts. Subcentimeter hypodensities too small to characterize. No hydronephrosis. No contusion, laceration, or subcapsular hematoma. No injury to the vascular structures or collecting systems. No hydroureter. The urinary bladder is unremarkable. On delayed imaging, there is no urothelial wall thickening and there are no filling defects in the opacified portions of the bilateral collecting systems or ureters. Bowel: No small or large bowel wall thickening or dilatation. The appendix is unremarkable. Mesentery, Omentum, and Peritoneum: No simple free fluid ascites. No pneumoperitoneum. No hemoperitoneum. No mesenteric hematoma identified. No organized fluid collection. Pelvic Organs: Normal. Lymph Nodes: No abdominal, pelvic, inguinal lymphadenopathy. Vasculature: Least moderate calcified and noncalcified atherosclerotic plaque. 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 No abdominal aorta or iliac aneurysm. No active contrast extravasation or pseudoaneurysm. Musculoskeletal: No  significant soft tissue hematoma. No acute pelvic fracture. No spinal fracture. IMPRESSION: 1. Trace left pleural fluid - hemothorax not excluded. 2. Tiny foci of left apical gas likely represents paraseptal emphysematous changes/scarring versus less likely a tiny left apical pneumothorax. 3. Acute left 9-12 rib fractures with the 11th rib comminuted and displaced and the 10th rib fractured in two different places. 4. No acute traumatic injury to the abdomen or pelvis. 5. No acute fracture or traumatic malalignment of the thoracic or lumbar spine. 6. Aortic Atherosclerosis (ICD10-I70.0) and Emphysema (ICD10-J43.9). Other imaging findings of potential clinical significance: 1. Indeterminate 1.6 cm left adrenal gland nodule. Recommend CT adrenal protocol in 1 year for further evaluation. 2. Aortic Atherosclerosis (ICD10-I70.0). 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 3.  Emphysema (ICD10-J43.9). Electronically Signed   By: Tish Frederickson M.D.   On: 01/25/2021 05:09    Anti-infectives: Anti-infectives (From admission, onward)    None        Assessment/Plan  Fall L rib FX 9-12 with trace HTX and ?tiny PTX - multimodal pain control, pulmonary toilet. COPD - not on meds, duonebs PRN   FEN: Reg ID: none VTE: SCD's, lovenox Dispo: med-surg, OT eval pending Yesterday PT recommended HHPT to reduce fall risk, I have ordered this and DME.   Anticipate discharge home this afternoon pending OT eval.    LOS: 1 day    Hosie Spangle, [facility] Please see Amion for pager number during day hours 7:00am-4:30pm

## 2021-01-26 NOTE — TOC Initial Note (Addendum)
Transition of Care [facility]) - Initial/Assessment Note    Patient Details  Name: Mark Bird MRN: [id number] Date of Birth: 07-Apr-1951  Transition of Care [facility]) CM/SW Contact:    Kingsley Plan, RN Phone Number: 01/26/2021, 10:36 AM  Clinical Narrative:                 Spoke to patient at bedside. Confirmed PCP and phone number. Patient plans to stay with sister in [place] at discharge, unsure of address. Patient wants to go to [facility] [address], [place] . Called spoke to [place] they will fax NCM referral form to NCM for NCM to have PA sign. Patient has appointment Monday 3 pm.Referral faxed   Tub bench and cane ordered.  Expected Discharge Plan: Home w Home Health Services     Patient Goals and CMS Choice Patient states their goals for this hospitalization and ongoing recovery are:: to return tohome CMS Medicare.gov Compare Post Acute Care list provided to:: Patient Choice offered to / list presented to : Patient  Expected Discharge Plan and Services Expected Discharge Plan: Home w Home Health Services   Discharge Planning Services: CM Consult Post Acute Care Choice: Home Health, Durable Medical Equipment Living arrangements for the past 2 months: Apartment Expected Discharge Date: 01/26/21               DME Arranged: Johann Capers bench DME Agency: [employer] Date DME Agency Contacted: 01/26/21 Time DME Agency Contacted: [phone] Representative spoke with at DME Agency: Velna Hatchet HH Arranged: NA          Prior Living Arrangements/Services Living arrangements for the past 2 months: Apartment Lives with:: Siblings Patient language and need for interpreter reviewed:: Yes Do you feel safe going back to the place where you live?: Yes      Need for Family Participation in Patient Care: Yes (Comment) Care giver support system in place?: Yes (comment) Current home services: DME Criminal Activity/Legal Involvement Pertinent to Current  Situation/Hospitalization: No - Comment as needed  Activities of Daily Living      Permission Sought/Granted   Permission granted to share information with : No              Emotional Assessment Appearance:: Appears stated age Attitude/Demeanor/Rapport: Engaged Affect (typically observed): Accepting Orientation: : Oriented to Self, Oriented to Place, Oriented to  Time, Oriented to Situation Alcohol / Substance Use: Not Applicable Psych Involvement: No (comment)  Admission diagnosis:  Multiple rib fractures [S22.49XA] Abdominal trauma [S39.91XA] Fall, initial encounter [W19.XXXA] Closed fracture of multiple ribs of left side, initial encounter [S22.42XA] Rib fractures [S22.49XA] Patient Active Problem List   Diagnosis Date Noted   Multiple rib fractures 01/25/2021   Rib fractures 01/25/2021   PCP:  Elinor Dodge, MD Pharmacy:   [facility] [phone] - [place], [place] - [address] AT [facility] [address] [place] [address] Phone: [phone] Fax: [phone]     Social Determinants of Health (SDOH) Interventions    Readmission Risk Interventions No flowsheet data found.

## 2021-01-26 NOTE — Discharge Summary (Signed)
[facility] Discharge Summary   Patient ID: Mark Bird MRN: [id number] DOB/AGE: 1951-05-07 70 y.o.  Admit date: 01/25/2021 Discharge date: 01/26/2021  Discharge Diagnosis Patient Active Problem List   Diagnosis Date Noted   Multiple rib fractures 01/25/2021   Rib fractures 01/25/2021    Imaging: DG Ribs Unilateral W/Chest Left  Result Date: 01/25/2021 CLINICAL DATA:  Fall, rib pain EXAM: LEFT RIBS AND CHEST - 3+ VIEW COMPARISON:  None. FINDINGS: Single view radiograph of the chest and two view radiograph of the left ribs demonstrates acute, minimally displaced fractures of the left ninth and tenth ribs laterally. Mild left basilar atelectasis with slight elevation of the left hemidiaphragm. Lungs are otherwise clear. No pneumothorax or pleural effusion. Cardiac size within normal limits. IMPRESSION: Acute fractures of the left 9, 10 ribs.  No pneumothorax. Electronically Signed   By: Helyn Numbers MD   On: 01/25/2021 01:19   CT CHEST ABDOMEN PELVIS W CONTRAST  Result Date: 01/25/2021 CLINICAL DATA:  Status post fall. History of back surgery. Pain left side EXAM: CT CHEST, ABDOMEN, AND PELVIS WITH CONTRAST TECHNIQUE: Multidetector CT imaging of the chest, abdomen and pelvis was performed following the standard protocol during bolus administration of intravenous contrast. CONTRAST:  OMNIPAQUE IOHEXOL 300 MG/ML  SOLN COMPARISON:  None. FINDINGS: CHEST: Ports and Devices: None. Lungs/airways: At least moderate paraseptal and centrilobular emphysematous changes. Biapical pleural/pulmonary scarring. Bilateral lower lobe subsegmental atelectasis. No focal consolidation. No pulmonary nodule. No pulmonary mass. No pulmonary contusion or laceration. No pneumatocele formation. The central airways are patent. Pleura: No pleural effusion. Trace left pleural fluid. No right pleural effusion or hemothorax. Lymph Nodes: No mediastinal, hilar, or axillary lymphadenopathy. Mediastinum: No  pneumomediastinum. No aortic injury or mediastinal hematoma. The thoracic aorta is normal in caliber. Atherosclerotic plaque of the origin of the left subclavian artery. The heart is normal in size. No significant pericardial effusion. The main pulmonary artery is normal in caliber. No central pulmonary embolus. The esophagus is unremarkable. The thyroid is unremarkable. Chest Wall / Breasts: No chest wall mass. Musculoskeletal: Acute comminuted and displaced left posterior eleventh rib fracture. Acute minimally displaced left posterior and posterolateral tenth rib fracture broken in 2 different places. Acute minimally displaced left lateral ninth rib fracture. Acute nondisplaced lateral twelfth rib fracture. No sternal fracture. No spinal fracture. ABDOMEN / PELVIS: Liver: Not enlarged. No focal lesion. No laceration or subcapsular hematoma. Biliary System: The gallbladder is otherwise unremarkable with no radio-opaque gallstones. No biliary ductal dilatation. Pancreas: Normal pancreatic contour. No main pancreatic duct dilatation. Spleen: Not enlarged. No focal lesion. No laceration, subcapsular hematoma, or vascular injury. Adrenal Glands: No right adrenal gland nodularity. There is a heterogeneous 1.6 cm left adrenal gland nodule with a density of 41 Hounsfield units. Kidneys: Bilateral kidneys enhance symmetrically. Several bilateral fluid density lesions likely represent simple renal cysts. Subcentimeter hypodensities too small to characterize. No hydronephrosis. No contusion, laceration, or subcapsular hematoma. No injury to the vascular structures or collecting systems. No hydroureter. The urinary bladder is unremarkable. On delayed imaging, there is no urothelial wall thickening and there are no filling defects in the opacified portions of the bilateral collecting systems or ureters. Bowel: No small or large bowel wall thickening or dilatation. The appendix is unremarkable. Mesentery, Omentum, and  Peritoneum: No simple free fluid ascites. No pneumoperitoneum. No hemoperitoneum. No mesenteric hematoma identified. No organized fluid collection. Pelvic Organs: Normal. Lymph Nodes: No abdominal, pelvic, inguinal lymphadenopathy. Vasculature: Least moderate calcified and noncalcified atherosclerotic plaque.  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 No abdominal aorta or iliac aneurysm. No active contrast extravasation or pseudoaneurysm. Musculoskeletal: No significant soft tissue hematoma. No acute pelvic fracture. No spinal fracture. IMPRESSION: 1. Trace left pleural fluid - hemothorax not excluded. 2. Tiny foci of left apical gas likely represents paraseptal emphysematous changes/scarring versus less likely a tiny left apical pneumothorax. 3. Acute left 9-12 rib fractures with the 11th rib comminuted and displaced and the 10th rib fractured in two different places. 4. No acute traumatic injury to the abdomen or pelvis. 5. No acute fracture or traumatic malalignment of the thoracic or lumbar spine. 6. Aortic Atherosclerosis (ICD10-I70.0) and Emphysema (ICD10-J43.9). Other imaging findings of potential clinical significance: 1. Indeterminate 1.6 cm left adrenal gland nodule. Recommend CT adrenal protocol in 1 year for further evaluation. 2. Aortic Atherosclerosis (ICD10-I70.0). 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 3.  Emphysema (ICD10-J43.9). Electronically Signed   By: Tish Frederickson M.D.   On: 01/25/2021 05:09   DG Chest Port 1 View  Result Date: 01/26/2021 CLINICAL DATA:  69 year old male with history of left-sided hemothorax. EXAM: PORTABLE CHEST 1 VIEW COMPARISON:  Chest x-ray 01/25/2021. FINDINGS: Lung volumes are slightly low. Opacity in the left base favored to reflect areas of subsegmental atelectasis. Trace left pleural effusion. No pneumothorax. No evidence of pulmonary edema. Heart size is  normal. Upper mediastinal contours are within normal limits. Minimally displaced fracture of the posterior aspect of the left eleventh rib. Known nondisplaced fracture of the posterior left twelfth rib better demonstrated on recent chest CT. IMPRESSION: 1. Low lung volumes with subsegmental atelectasis in the left lower lobe and trace left pleural effusion. 2. Left-sided rib fractures better demonstrated on yesterday's chest CT. Electronically Signed   By: Trudie Reed M.D.   On: 01/26/2021 09:10    Procedures None   Hospital Course:  70 y/o M with PMH who was transferred to [place] [facility] for management of left sided rib fractures 9-12 after a fall.  He also had trace hemothorax and a possible tiny left pneumothorax. Patient was admitted for pain control and observation. On hospital day #1 chest x-ray revealed no pneumothorax. He worked with PT/OT. On 01/26/21 he patient was voiding well, tolerating diet, ambulating well, pain well controlled, vital signs stable, and felt stable for discharge home.  Patient will follow up in our office as needed and knows to call with questions or concerns.    I have personally reviewed the patients medication history on the [place] controlled substance database.  Allergies as of 01/26/2021   No Known Allergies      Medication List     TAKE these medications    acetaminophen 325 MG tablet Commonly known as: TYLENOL Take 2 tablets (650 mg total) by mouth every 6 (six) hours as needed.   amLODipine 10 MG tablet Commonly known as: NORVASC Take 10 mg by mouth daily.   aspirin EC 81 MG tablet Take 81 mg by mouth daily. Swallow whole.   gabapentin 300 MG capsule Commonly known as: NEURONTIN Take 300 mg by mouth at bedtime.   HYDROcodone-acetaminophen 5-325 MG tablet Commonly known as: NORCO/VICODIN Take 1 tablet by mouth every 4 (four) hours as needed.   methocarbamol 500 MG tablet Commonly known as: ROBAXIN Take 1 tablet (500  mg total) by mouth every 8 (eight) hours as needed (use for muscle cramps/pain).   nortriptyline 75  MG capsule Commonly known as: PAMELOR Take 75 mg by mouth at bedtime.   pravastatin 40 MG tablet Commonly known as: PRAVACHOL Take 40 mg by mouth at bedtime.               Durable Medical Equipment  (From admission, onward)           Start     Ordered   01/26/21 0746  For home use only DME Tub bench  Once        01/26/21 0746   01/26/21 0746  For home use only DME Cane  Once        01/26/21 2947              Follow-up Information     Sanchez-Brugal, Rockey Situ, MD. Schedule an appointment as soon as possible for a visit in 2 days.   Specialty: Internal Medicine Contact information: [address] [place] [address] [phone]         [facility] Follow up.   Contact information: [address] [place]   Appointment Monday at 3 pm                Signed: Hosie Spangle, [facility] 01/26/2021, 12:14 PM

## 2021-01-28 ENCOUNTER — Other Ambulatory Visit: Payer: Self-pay

## 2021-01-28 ENCOUNTER — Emergency Department (HOSPITAL_COMMUNITY)
Admission: EM | Admit: 2021-01-28 | Discharge: 2021-01-28 | Disposition: A | Discharge status: Other Acute Inpatient Hospital | Payer: Medicare HMO | Attending: Emergency Medicine | Admitting: Emergency Medicine

## 2021-01-28 ENCOUNTER — Encounter (HOSPITAL_COMMUNITY): Payer: Self-pay | Admitting: *Deleted

## 2021-01-28 ENCOUNTER — Emergency Department (HOSPITAL_COMMUNITY): Payer: Medicare HMO

## 2021-01-28 DIAGNOSIS — R0781 Pleurodynia: Secondary | ICD-10-CM

## 2021-01-28 DIAGNOSIS — Z79899 Other long term (current) drug therapy: Secondary | ICD-10-CM | POA: Insufficient documentation

## 2021-01-28 DIAGNOSIS — W19XXXD Unspecified fall, subsequent encounter: Secondary | ICD-10-CM | POA: Insufficient documentation

## 2021-01-28 DIAGNOSIS — F17210 Nicotine dependence, cigarettes, uncomplicated: Secondary | ICD-10-CM | POA: Diagnosis not present

## 2021-01-28 DIAGNOSIS — Z7982 Long term (current) use of aspirin: Secondary | ICD-10-CM | POA: Diagnosis not present

## 2021-01-28 DIAGNOSIS — S2242XD Multiple fractures of ribs, left side, subsequent encounter for fracture with routine healing: Secondary | ICD-10-CM | POA: Diagnosis not present

## 2021-01-28 DIAGNOSIS — S299XXD Unspecified injury of thorax, subsequent encounter: Secondary | ICD-10-CM | POA: Diagnosis present

## 2021-01-28 DIAGNOSIS — I10 Essential (primary) hypertension: Secondary | ICD-10-CM | POA: Insufficient documentation

## 2021-01-28 MED ORDER — HYDROCODONE-ACETAMINOPHEN 5-325 MG PO TABS
1.0000 | ORAL_TABLET | Freq: Once | ORAL | Status: AC
  Administered 2021-01-28: 1 via ORAL
  Filled 2021-01-28: qty 1

## 2021-01-28 NOTE — Discharge Instructions (Addendum)
It is very important for you to take the hydrocodone for your pain.   We have provided you referral to home health services.  They will be contacting you.  Return to emergency department any fevers, cough difficulty breathing or worsening pain.

## 2021-01-28 NOTE — Care Management (Signed)
ED RNCM met with patient at bedside. He reports being discharged from the hospital 6/24 to self care with OP-PT.  He presents today with increase pain and his family states, he is not able to care for self and believes he needs a rehab stay.  Patient did not meet criteria for SNF/Rehab placement, explained to patient who verbalized understanding, but offered to send in referral for HHPT instead patient is agreeable. ED RNCM obtained permission to speak patient's sister Teressa Lower  [phone], CM explained that patient did not qualify for SNF/Rehab level of care. Updated her on the plan for HHPT. Patient has [facility], and discussed the preferred [facility] agencies who accept his insurance patient states he does not have a preference. ED CM sent referral to [facility] also contacted Lattie Haw the [facility] Liaison concerning the referral. Noted that patient's sister is the contact .

## 2021-01-28 NOTE — ED Provider Notes (Signed)
[facility] Provider Note   CSN: [id number] Arrival date & time: 01/28/21  1321     History Chief Complaint  Patient presents with   Rib Injury    [name] is a 70 y.o. male with PMH/o HTN, high cholesterol who presents for evaluation of left rib pain. He was admitted to the hospital last week after a fall for rib fractures 9-12. He was d/c from the hospital on 01/26/21. He was discharged with tylenol, Vicodin, and robaxin.  He has not been taking any of the Vicodin at home.  He states that he comes into because he is still uncomfortable and is having pain.  He states it hurts more when he particularly moves, bends.  He reports that he does not want to take a deep breath because the pain hurts.  He is not any fevers, cough.  No new trauma, injury, fall.  The history is provided by the patient.      Past Medical History:  Diagnosis Date   High cholesterol    Hypertension     Patient Active Problem List   Diagnosis Date Noted   Multiple rib fractures 01/25/2021   Rib fractures 01/25/2021    Past Surgical History:  Procedure Laterality Date   BACK SURGERY         No family history on file.  Social History   Tobacco Use   Smoking status: Some Days    Pack years: 0.00    Types: Cigarettes   Smokeless tobacco: Never  Substance Use Topics   Alcohol use: Not Currently   Drug use: Not Currently    Home Medications Prior to Admission medications   Medication Sig Start Date End Date Taking? Authorizing Provider  acetaminophen (TYLENOL) 325 MG tablet Take 2 tablets (650 mg total) by mouth every 6 (six) hours as needed. 01/26/21   Adam PhenixSimaan, Elizabeth S, PA-C  amLODipine (NORVASC) 10 MG tablet Take 10 mg by mouth daily. 01/16/21   [provider]  aspirin EC 81 MG tablet Take 81 mg by mouth daily. Swallow whole.    [provider]  gabapentin (NEURONTIN) 300 MG capsule Take 300 mg by mouth at bedtime. 04/05/18    [provider]  HYDROcodone-acetaminophen (NORCO/VICODIN) 5-325 MG tablet Take 1 tablet by mouth every 4 (four) hours as needed. 01/25/21   Rancour, Jeannett SeniorStephen, MD  methocarbamol (ROBAXIN) 500 MG tablet Take 1 tablet (500 mg total) by mouth every 8 (eight) hours as needed (use for muscle cramps/pain). 01/26/21   Adam PhenixSimaan, Elizabeth S, PA-C  nortriptyline (PAMELOR) 75 MG capsule Take 75 mg by mouth at bedtime. 11/11/20   [provider]  pravastatin (PRAVACHOL) 40 MG tablet Take 40 mg by mouth at bedtime. 01/15/21   [provider]    Allergies    Patient has no known allergies.  Review of Systems   Review of Systems  Constitutional:  Negative for fever.  Respiratory:  Negative for shortness of breath.   Cardiovascular:  Negative for chest pain.  Gastrointestinal:  Negative for abdominal pain, nausea and vomiting.  Musculoskeletal:        Chest wall  Neurological:  Negative for headaches.  All other systems reviewed and are negative.  Physical Exam Updated Vital Signs BP (!) 148/90 (BP Location: Right Arm)   Pulse 69   Temp 98.3 F (36.8 C) (Tympanic)   Resp 20   SpO2 99%   Physical Exam Vitals and nursing note reviewed.  Constitutional:  Appearance: Normal appearance. He is well-developed.  HENT:     Head: Normocephalic and atraumatic.  Eyes:     General: Lids are normal.     Conjunctiva/sclera: Conjunctivae normal.     Pupils: Pupils are equal, round, and reactive to light.  Cardiovascular:     Rate and Rhythm: Normal rate and regular rhythm.     Pulses: Normal pulses.          Radial pulses are 2+ on the right side and 2+ on the left side.     Heart sounds: Normal heart sounds. No murmur heard.   No friction rub. No gallop.  Pulmonary:     Effort: Pulmonary effort is normal.     Breath sounds: Normal breath sounds.     Comments: Lungs clear to auscultation bilaterally.  Symmetric chest rise.  No wheezing, rales, rhonchi. Chest:     Comments:  Diffuse tenderness palpation noted to the left chest wall.  No deformity or crepitus noted. Abdominal:     Palpations: Abdomen is soft. Abdomen is not rigid.     Tenderness: There is no abdominal tenderness. There is no guarding.     Comments: Abdomen is soft, non-distended, non-tender. No rigidity, No guarding. No peritoneal signs.  Musculoskeletal:        General: Normal range of motion.     Cervical back: Full passive range of motion without pain.  Skin:    General: Skin is warm and dry.     Capillary Refill: Capillary refill takes less than 2 seconds.  Neurological:     Mental Status: He is alert and oriented to person, place, and time.  Psychiatric:        Speech: Speech normal.    ED Results / Procedures / Treatments   Labs (all labs ordered are listed, but only abnormal results are displayed) Labs Reviewed - No data to display  EKG None  Radiology DG Ribs Unilateral W/Chest Left  Result Date: 01/28/2021 CLINICAL DATA:  Fall 4 days ago.  Left rib pain. EXAM: LEFT RIBS AND CHEST - 3+ VIEW COMPARISON:  Rib x-rays January 25, 2021.  Chest x-ray January 26, 2021. FINDINGS: The known left ninth and tenth rib fractures are again identified. Alignment at the rib fractures is unchanged. No pneumothorax. The cardiomediastinal silhouette is stable. The right lung is clear. Mild opacity adjacent to the left hemidiaphragm is consistent with atelectasis and possibly a tiny effusion. IMPRESSION: 1. Known left ninth and tenth rib fractures without pneumothorax. 2. Probable tiny effusion on the left with underlying atelectasis. Electronically Signed   By: Gerome Sam III M.D   On: 01/28/2021 16:35    Procedures Procedures   Medications Ordered in ED Medications  HYDROcodone-acetaminophen (NORCO/VICODIN) 5-325 MG per tablet 1 tablet (1 tablet Oral Given 01/28/21 1600)    ED Course  I have reviewed the triage vital signs and the nursing notes.  Pertinent labs & imaging results that were  available during my care of the patient were reviewed by me and considered in my medical decision making (see chart for details).    MDM Rules/Calculators/A&P                          70 y.o. M with hypertension, high cholesterol with recent hospitalization for rib fractures.  He was discharged on 01/04/2021 with Robaxin, Tylenol, hydrocodone.  He comes in today because he still having pain.  No new trauma, injury, fall.  He  tells me at home he is taking the Robaxin and the Tylenol but he has not taken any of the hydrocodone.  He states it hurts more when he bends, moves any has trouble getting comfortable.  He states when he takes a sharp deep breath in, it hurts.  He has not had any fevers.  He denies any abdominal pain.  Initial arrival, he is afebrile, nontoxic-appearing.  Vital signs are stable.  On exam, he has tenderness palpation of the left chest wall with no deformity or crepitus noted.  No flail chest.  Lungs clear to auscultation bilaterally.  I suspect his pain is uncontrolled because he has not been taking the hydrocodone.  We will repeat a chest x-ray here and give him pain medication here in the ED.  X-ray shows confirmed rib fracture seen previously.  He has a small pleural effusion which was seen previously on his x-ray on 01/26/2021.  It does not look worse.  No evidence of pneumonia, atelectasis.  Patient was sleeping on my reevaluation.  I woke him up.  He reports improvement after the hydrocodone here in the ED.  He has been hemodynamically stable in the ED with no signs of hypoxia, tachycardia.  At this time, I feel he is stable for discharge.  I discussed with patient sister via phone.  She was concerned about patient being able to manage his pain at home and was questioning admission for pain control.  At this time, patient is hemodynamically stable and pain is controlled here in the ED with hydrocodone use.  I suspect his pain was not controlled at home because he was not taking the  hydrocodone.  I discussed with him that this is important in pain management.  She is requesting Child psychotherapist consult.  I discussed with social work.  She will touch base with patient.  I discussed with social work after family consultation.  At this time, patient does not meet criteria for admission or for placement.  Patient instructed to take his hydrocodone.  We will plan for home OT/PT. Patient given a walker here.  At this time, patient is hemodynamically stable with no hypoxia, tachycardia.  I suspect his pain was not controlled because he is not taking the hydrocodone.  We discussed the importance of hydrocodone in pain management. At this time, patient exhibits no emergent life-threatening condition that require further evaluation in ED. Patient had ample opportunity for questions and discussion. All patient's questions were answered with full understanding. Strict return precautions discussed. Patient expresses understanding and agreement to plan.   Portions of this note were generated with Scientist, clinical (histocompatibility and immunogenetics). Dictation errors may occur despite best attempts at proofreading.  Final Clinical Impression(s) / ED Diagnoses Final diagnoses:  Rib pain on left side  Closed fracture of multiple ribs of left side with routine healing, subsequent encounter    Rx / DC Orders ED Discharge Orders          Ordered    For home use only DME 4 wheeled rolling walker with seat        01/28/21 1726    Home Health        01/28/21 1726    Face-to-face encounter (required for Medicare/Medicaid patients)       Comments: I Maxwell Caul certify that this patient is under my care and that I, or a nurse practitioner or physician's assistant working with me, had a face-to-face encounter that meets the physician face-to-face encounter requirements with this patient  on 01/28/2021. The encounter with the patient was in whole, or in part for the following medical condition(s) which is the primary reason  for home health care (List medical condition): Rib pain   01/28/21 1726             Rosana Hoes 01/28/21 2014    Tilden Fossa, MD 01/28/21 2144

## 2021-01-28 NOTE — ED Triage Notes (Signed)
Pt was admitted from Wednesday until Friday due to rib fractures from a fall.  Pt is here today due to continued sever pain which is not improving.  Pt was d/c with prescription for vicodin and robaxin and this is not helping his pain.

## 2021-01-28 NOTE — ED Notes (Signed)
Patient Alert and oriented to baseline. Stable and ambulatory to baseline. Patient verbalized understanding of the discharge instructions.  Patient belongings were taken by the patient.   

## 2021-02-08 ENCOUNTER — Telehealth: Payer: Self-pay | Admitting: Surgery

## 2021-02-08 NOTE — Telephone Encounter (Signed)
ED RNCM received call from patient stating that he was supposed to receive a shower chair but has not received it as of yet. RNCM check  orders and Parachute, Shower chair reordered via parachute.

## 2021-03-08 ENCOUNTER — Other Ambulatory Visit: Payer: Self-pay

## 2021-03-08 ENCOUNTER — Emergency Department (HOSPITAL_BASED_OUTPATIENT_CLINIC_OR_DEPARTMENT_OTHER)
Admission: EM | Admit: 2021-03-08 | Discharge: 2021-03-08 | Disposition: A | Discharge status: Home / Self Care | Payer: Medicare HMO | Attending: Emergency Medicine | Admitting: Emergency Medicine

## 2021-03-08 ENCOUNTER — Encounter (HOSPITAL_BASED_OUTPATIENT_CLINIC_OR_DEPARTMENT_OTHER): Payer: Self-pay | Admitting: *Deleted

## 2021-03-08 ENCOUNTER — Emergency Department (HOSPITAL_BASED_OUTPATIENT_CLINIC_OR_DEPARTMENT_OTHER): Payer: Medicare HMO

## 2021-03-08 DIAGNOSIS — F17210 Nicotine dependence, cigarettes, uncomplicated: Secondary | ICD-10-CM | POA: Insufficient documentation

## 2021-03-08 DIAGNOSIS — S4992XA Unspecified injury of left shoulder and upper arm, initial encounter: Secondary | ICD-10-CM

## 2021-03-08 DIAGNOSIS — Y92019 Unspecified place in single-family (private) house as the place of occurrence of the external cause: Secondary | ICD-10-CM | POA: Insufficient documentation

## 2021-03-08 DIAGNOSIS — I10 Essential (primary) hypertension: Secondary | ICD-10-CM | POA: Insufficient documentation

## 2021-03-08 DIAGNOSIS — S50812A Abrasion of left forearm, initial encounter: Secondary | ICD-10-CM | POA: Diagnosis present

## 2021-03-08 DIAGNOSIS — W010XXA Fall on same level from slipping, tripping and stumbling without subsequent striking against object, initial encounter: Secondary | ICD-10-CM | POA: Insufficient documentation

## 2021-03-08 NOTE — Discharge Instructions (Addendum)
The x-ray did not show any broken bones in your arm.  Keep the wound clean and dry.  Follow with your primary care doctor.

## 2021-03-08 NOTE — ED Provider Notes (Signed)
Emergency Department Provider Note   I have reviewed the triage vital signs and the nursing notes.   HISTORY  Chief Complaint Arm Injury   HPI Mark Bird is a 70 y.o. male presents to the ED with arm injury after fall yesterday. Patient had a mechanical fall at home. No head injury or LOC. Noted some abrasion to the left forearm. No numbness/weakness in the extremity. Placed a dressing on the abrasion. Bleeding controlled. No radiation of symptoms or modifying factors.    Past Medical History:  Diagnosis Date   High cholesterol    Hypertension     Patient Active Problem List   Diagnosis Date Noted   Multiple rib fractures 01/25/2021   Rib fractures 01/25/2021    Past Surgical History:  Procedure Laterality Date   BACK SURGERY      Allergies Patient has no known allergies.  No family history on file.  Social History Social History   Tobacco Use   Smoking status: Some Days    Types: Cigarettes   Smokeless tobacco: Never  Vaping Use   Vaping Use: Never used  Substance Use Topics   Alcohol use: Not Currently   Drug use: Not Currently    Review of Systems  Constitutional: No fever/chills Eyes: No visual changes. ENT: No sore throat. Cardiovascular: Denies chest pain. Respiratory: Denies shortness of breath. Gastrointestinal: No abdominal pain.  No nausea, no vomiting.  No diarrhea.  No constipation. Genitourinary: Negative for dysuria. Musculoskeletal: Negative for back pain. Left forearm pain.  Skin: Left forearm abrasion.  Neurological: Negative for headaches, focal weakness or numbness.  10-point ROS otherwise negative.  ____________________________________________   PHYSICAL EXAM:  VITAL SIGNS: ED Triage Vitals  Enc Vitals Group     BP 03/08/21 2041 139/78     Pulse Rate 03/08/21 2041 78     Resp 03/08/21 2041 18     Temp 03/08/21 2041 98.2 F (36.8 C)     Temp Source 03/08/21 2041 Oral     SpO2 03/08/21 2041 96 %     Weight  03/08/21 2040 138 lb 14.2 oz (63 kg)     Height 03/08/21 2040 5\' 8"  (1.727 m)   Constitutional: Alert and oriented. Well appearing and in no acute distress. Eyes: Conjunctivae are normal.  Head: Atraumatic. Nose: No congestion/rhinnorhea. Mouth/Throat: Mucous membranes are moist.  Neck: No stridor.  No cervical spine tenderness to palpation. Cardiovascular: Good peripheral circulation. Respiratory: Normal respiratory effort.   Gastrointestinal: No distention.  Musculoskeletal: No lower extremity tenderness nor edema. No gross deformities of extremities. No wrist tenderness. No scaphoid tenderness. Normal ROM of the left wrist, elbow, and shoulder.  Neurologic:  Normal speech and language. No gross focal neurologic deficits are appreciated.  Skin:  Skin is warm and dry. Minor, superficial abrasion to the forearm with faint eschar and no laceration. Wound is hemostatic.   ______________________________________  RADIOLOGY  DG Forearm Left  Result Date: 03/08/2021 CLINICAL DATA:  Fall with arm pain EXAM: LEFT FOREARM - 2 VIEW COMPARISON:  None. FINDINGS: There is no evidence of fracture or other focal bone lesions. Soft tissues are unremarkable. IMPRESSION: Negative. Electronically Signed   By: 05/08/2021 M.D.   On: 03/08/2021 21:13    ____________________________________________   PROCEDURES  Procedure(s) performed:   Procedures  None ____________________________________________   INITIAL IMPRESSION / ASSESSMENT AND PLAN / ED COURSE  Pertinent labs & imaging results that were available during my care of the patient were reviewed by me and  considered in my medical decision making (see chart for details).   Patient presents to the ED with mechanical fall and forearm pain. No wrist tenderness to suspect forearm fracture. No scaphoid tenderness specifically. Minor abrasion noted. No additional dressing required and wound is very minor. Vitals normal here. Plan for PCP follow up  PRN and discussed ED return precautions.    ____________________________________________  FINAL CLINICAL IMPRESSION(S) / ED DIAGNOSES  Final diagnoses:  Injury of left upper extremity, initial encounter     Note:  This document was prepared using Dragon voice recognition software and may include unintentional dictation errors.  Alona Bene, MD, [facility] Emergency Medicine    Quinette Hentges, Arlyss Repress, MD 03/13/21 [phone]

## 2021-03-08 NOTE — ED Notes (Signed)
Teaching done with Pt. On cleaning the abraded area.

## 2021-03-08 NOTE — ED Triage Notes (Signed)
He lost his balance and fell yesterday. Swelling to his left forearm. Skin tear under his dressing.

## 2021-03-16 ENCOUNTER — Other Ambulatory Visit: Payer: Self-pay

## 2021-03-16 ENCOUNTER — Encounter (HOSPITAL_COMMUNITY): Payer: Self-pay

## 2021-03-16 ENCOUNTER — Emergency Department (HOSPITAL_COMMUNITY)
Admission: EM | Admit: 2021-03-16 | Discharge: 2021-03-16 | Disposition: A | Discharge status: Home / Self Care | Payer: Medicare HMO | Attending: Emergency Medicine | Admitting: Emergency Medicine

## 2021-03-16 ENCOUNTER — Emergency Department (HOSPITAL_COMMUNITY): Payer: Medicare HMO

## 2021-03-16 DIAGNOSIS — I10 Essential (primary) hypertension: Secondary | ICD-10-CM | POA: Insufficient documentation

## 2021-03-16 DIAGNOSIS — R531 Weakness: Secondary | ICD-10-CM | POA: Diagnosis not present

## 2021-03-16 DIAGNOSIS — M79605 Pain in left leg: Secondary | ICD-10-CM | POA: Insufficient documentation

## 2021-03-16 DIAGNOSIS — Z7982 Long term (current) use of aspirin: Secondary | ICD-10-CM | POA: Insufficient documentation

## 2021-03-16 DIAGNOSIS — Z79899 Other long term (current) drug therapy: Secondary | ICD-10-CM | POA: Insufficient documentation

## 2021-03-16 DIAGNOSIS — Y900 Blood alcohol level of less than 20 mg/100 ml: Secondary | ICD-10-CM | POA: Diagnosis not present

## 2021-03-16 DIAGNOSIS — Z20822 Contact with and (suspected) exposure to covid-19: Secondary | ICD-10-CM | POA: Insufficient documentation

## 2021-03-16 DIAGNOSIS — R410 Disorientation, unspecified: Secondary | ICD-10-CM | POA: Diagnosis not present

## 2021-03-16 DIAGNOSIS — F17210 Nicotine dependence, cigarettes, uncomplicated: Secondary | ICD-10-CM | POA: Diagnosis not present

## 2021-03-16 LAB — URINALYSIS, ROUTINE W REFLEX MICROSCOPIC
Bacteria, UA: NONE SEEN
Bilirubin Urine: NEGATIVE
Glucose, UA: NEGATIVE mg/dL
Ketones, ur: NEGATIVE mg/dL
Leukocytes,Ua: NEGATIVE
Nitrite: NEGATIVE
Protein, ur: NEGATIVE mg/dL
Specific Gravity, Urine: 1.014 (ref 1.005–1.030)
pH: 7 (ref 5.0–8.0)

## 2021-03-16 LAB — ETHANOL: Alcohol, Ethyl (B): <10 mg/dL (ref <10)

## 2021-03-16 LAB — RAPID URINE DRUG SCREEN, HOSP PERFORMED
Amphetamines: NOT DETECTED
Barbiturates: NOT DETECTED
Benzodiazepines: NOT DETECTED
Cocaine: NOT DETECTED
Opiates: NOT DETECTED
Tetrahydrocannabinol: POSITIVE — AB

## 2021-03-16 LAB — COMPREHENSIVE METABOLIC PANEL
ALT: 13 U/L (ref 0–44)
AST: 18 U/L (ref 15–41)
Albumin: 4 g/dL (ref 3.5–5.0)
Alkaline Phosphatase: 88 U/L (ref 38–126)
Anion gap: 8 (ref 5–15)
BUN: 12 mg/dL (ref 8–23)
CO2: 27 mmol/L (ref 22–32)
Calcium: 9.5 mg/dL (ref 8.9–10.3)
Chloride: 105 mmol/L (ref 98–111)
Creatinine, Ser: 1.1 mg/dL (ref 0.61–1.24)
GFR, Estimated: >60 mL/min (ref >60)
Glucose, Bld: 78 mg/dL (ref 70–99)
Potassium: 3.8 mmol/L (ref 3.5–5.1)
Sodium: 140 mmol/L (ref 135–145)
Total Bilirubin: 0.7 mg/dL (ref 0.3–1.2)
Total Protein: 7.4 g/dL (ref 6.5–8.1)

## 2021-03-16 LAB — PROTIME-INR
INR: 1 (ref 0.8–1.2)
Prothrombin Time: 13.2 seconds (ref 11.4–15.2)

## 2021-03-16 LAB — DIFFERENTIAL
Abs Immature Granulocytes: 0.02 10*3/uL (ref 0.00–0.07)
Basophils Absolute: 0.1 10*3/uL (ref 0.0–0.1)
Basophils Relative: 1 %
Eosinophils Absolute: 0.3 10*3/uL (ref 0.0–0.5)
Eosinophils Relative: 3 %
Immature Granulocytes: 0 %
Lymphocytes Relative: 32 %
Lymphs Abs: 3 10*3/uL (ref 0.7–4.0)
Monocytes Absolute: 0.7 10*3/uL (ref 0.1–1.0)
Monocytes Relative: 8 %
Neutro Abs: 5.2 10*3/uL (ref 1.7–7.7)
Neutrophils Relative %: 56 %

## 2021-03-16 LAB — CBC
HCT: 43.1 % (ref 39.0–52.0)
Hemoglobin: 14 g/dL (ref 13.0–17.0)
MCH: 28.2 pg (ref 26.0–34.0)
MCHC: 32.5 g/dL (ref 30.0–36.0)
MCV: 86.9 fL (ref 80.0–100.0)
Platelets: 341 10*3/uL (ref 150–400)
RBC: 4.96 MIL/uL (ref 4.22–5.81)
RDW: 15.7 % — ABNORMAL HIGH (ref 11.5–15.5)
WBC: 9.3 10*3/uL (ref 4.0–10.5)
nRBC: 0 % (ref 0.0–0.2)

## 2021-03-16 LAB — TROPONIN I (HIGH SENSITIVITY): Troponin I (High Sensitivity): 3 ng/L (ref <18)

## 2021-03-16 LAB — I-STAT CHEM 8, ED
BUN: 11 mg/dL (ref 8–23)
Calcium, Ion: 1.22 mmol/L (ref 1.15–1.40)
Chloride: 104 mmol/L (ref 98–111)
Creatinine, Ser: 1 mg/dL (ref 0.61–1.24)
Glucose, Bld: 77 mg/dL (ref 70–99)
HCT: 41 % (ref 39.0–52.0)
Hemoglobin: 13.9 g/dL (ref 13.0–17.0)
Potassium: 4 mmol/L (ref 3.5–5.1)
Sodium: 141 mmol/L (ref 135–145)
TCO2: 28 mmol/L (ref 22–32)

## 2021-03-16 LAB — RESP PANEL BY RT-PCR (FLU A&B, COVID) ARPGX2
Influenza A by PCR: NEGATIVE
Influenza B by PCR: NEGATIVE
SARS Coronavirus 2 by RT PCR: NEGATIVE

## 2021-03-16 LAB — APTT: aPTT: 31 seconds (ref 24–36)

## 2021-03-16 NOTE — ED Notes (Signed)
Provider at the bedside.  

## 2021-03-16 NOTE — ED Provider Notes (Signed)
[facility]-EMERGENCY DEPT Provider Note   CSN: [id number] Arrival date & time: 03/16/21  1641     History Chief Complaint  Patient presents with   Weakness    Mark Bird is a 70 y.o. male history of high cholesterol, hypertension, here presenting with weakness.  Patient states that he has been having left leg weakness for the last several days.  He states that he woke up and felt weaker and around 3:30 PM, he was watching TV and states that he has been confused and forgetful.  He also noticed that his legs felt very heavy.   The history is provided by the patient.      Past Medical History:  Diagnosis Date   High cholesterol    Hypertension     Patient Active Problem List   Diagnosis Date Noted   Multiple rib fractures 01/25/2021   Rib fractures 01/25/2021    Past Surgical History:  Procedure Laterality Date   BACK SURGERY         History reviewed. No pertinent family history.  Social History   Tobacco Use   Smoking status: Some Days    Types: Cigarettes   Smokeless tobacco: Never  Vaping Use   Vaping Use: Never used  Substance Use Topics   Alcohol use: Not Currently   Drug use: Not Currently    Home Medications Prior to Admission medications   Medication Sig Start Date End Date Taking? Authorizing Provider  acetaminophen (TYLENOL) 325 MG tablet Take 2 tablets (650 mg total) by mouth every 6 (six) hours as needed. 01/26/21   Adam Phenix, PA-C  amLODipine (NORVASC) 10 MG tablet Take 10 mg by mouth daily. 01/16/21   [provider]  aspirin EC 81 MG tablet Take 81 mg by mouth daily. Swallow whole.    [provider]  diclofenac Sodium (VOLTAREN) 1 % GEL Apply 2 g topically 4 (four) times daily as needed for pain. 01/28/21   [provider]  gabapentin (NEURONTIN) 300 MG capsule Take 300 mg by mouth at bedtime. 04/05/18   [provider]  HYDROcodone-acetaminophen (NORCO/VICODIN) 5-325 MG tablet  Take 1 tablet by mouth every 4 (four) hours as needed. 01/25/21   Rancour, Jeannett Senior, MD  methocarbamol (ROBAXIN) 500 MG tablet Take 1 tablet (500 mg total) by mouth every 8 (eight) hours as needed (use for muscle cramps/pain). 01/26/21   Adam Phenix, PA-C  nortriptyline (PAMELOR) 75 MG capsule Take 75 mg by mouth at bedtime. 11/11/20   [provider]  pravastatin (PRAVACHOL) 40 MG tablet Take 40 mg by mouth at bedtime. 01/15/21   [provider]    Allergies    Patient has no known allergies.  Review of Systems   Review of Systems  Neurological:  Positive for weakness.  Psychiatric/Behavioral:  Positive for confusion.   All other systems reviewed and are negative.  Physical Exam Updated Vital Signs BP 125/78   Pulse 69   Temp 98.2 F (36.8 C) (Oral)   Resp 18   Ht 5\' 8"  (1.727 m)   Wt 61.2 kg   SpO2 99%   BMI 20.53 kg/m   Physical Exam Vitals and nursing note reviewed.  Constitutional:      Appearance: Normal appearance.  HENT:     Head: Normocephalic.     Nose: Nose normal.     Mouth/Throat:     Mouth: Mucous membranes are moist.  Eyes:     Extraocular Movements: Extraocular movements intact.  Pupils: Pupils are equal, round, and reactive to light.  Cardiovascular:     Rate and Rhythm: Normal rate and regular rhythm.     Pulses: Normal pulses.     Heart sounds: Normal heart sounds.  Pulmonary:     Effort: Pulmonary effort is normal.     Breath sounds: Normal breath sounds.  Abdominal:     General: Abdomen is flat.     Palpations: Abdomen is soft.  Musculoskeletal:        General: Normal range of motion.     Cervical back: Normal range of motion and neck supple.  Skin:    General: Skin is warm.     Capillary Refill: Capillary refill takes less than 2 seconds.  Neurological:     Mental Status: He is alert.     Comments: No obvious facial droop.  Patient is slightly slow to respond but ANO x3.  Patient has no pronator drift.  Has normal  strength bilateral arms.  Patient has 4 out of 5 left leg strength and 5 out of 5 right leg strength  Psychiatric:        Mood and Affect: Mood normal.        Behavior: Behavior normal.    ED Results / Procedures / Treatments   Labs (all labs ordered are listed, but only abnormal results are displayed) Labs Reviewed  CBC - Abnormal; Notable for the following components:      Result Value   RDW 15.7 (*)    All other components within normal limits  RESP PANEL BY RT-PCR (FLU A&B, COVID) ARPGX2  ETHANOL  PROTIME-INR  APTT  DIFFERENTIAL  COMPREHENSIVE METABOLIC PANEL  RAPID URINE DRUG SCREEN, HOSP PERFORMED  URINALYSIS, ROUTINE W REFLEX MICROSCOPIC  I-STAT CHEM 8, ED  TROPONIN I (HIGH SENSITIVITY)    EKG EKG Interpretation  Date/Time:  Friday March 16 2021 17:01:04 EDT Ventricular Rate:  68 PR Interval:  178 QRS Duration: 88 QT Interval:  387 QTC Calculation: 412 R Axis:   36 Text Interpretation: Sinus rhythm No previous ECGs available Confirmed by Richardean Canal [phone]) on 03/16/2021 5:47:33 PM  Radiology MR BRAIN WO CONTRAST  Result Date: 03/16/2021 CLINICAL DATA:  Neurological deficit. Stroke suspected. Speech disturbance. Left-sided weakness. EXAM: MRI HEAD WITHOUT CONTRAST TECHNIQUE: Multiplanar, multiecho pulse sequences of the brain and surrounding structures were obtained without intravenous contrast. COMPARISON:  MRI 03/01/2021. FINDINGS: Brain: Diffusion imaging does not show any acute or subacute infarction. Mild chronic small-vessel ischemic changes affect pons. No focal cerebellar finding. 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 No cortical or large vessel territory infarction. No mass lesion, hemorrhage, hydrocephalus or extra-axial collection. No change since the previous examination. Vascular: Major vessels at the base of the brain show  flow. Skull and upper cervical spine: Negative Sinuses/Orbits: Clear/normal Other: None IMPRESSION: No change since 03/01/2021. No acute infarction shown by MRI. Mild to moderate chronic small-vessel ischemic changes of the pons, thalami and cerebral hemispheric white matter. Electronically Signed   By: Paulina Fusi M.D.   On: 03/16/2021 19:24    Procedures Procedures   Medications Ordered in ED Medications - No data to display  ED Course  I have reviewed the triage vital signs and the nursing notes.  Pertinent labs & imaging results that were available during my care of the patient were reviewed by me and considered in my medical decision  making (see chart for details).    MDM Rules/Calculators/A&P                          Mark Bird is a 70 y.o. male here presenting with weakness and confusion.  Consider stroke versus TIA. Patient's NIH scale is 0.  Plan to get MRI brain to rule out stroke.  Will get labs as well  7:48 PM MRI did not show new stroke.  I was able to review his chart further.  He actually had admission to [facility] about 2 weeks ago for the same thing.  He had MRI at that time that did not show a stroke.  He was seen by neurology and actually has outpatient neurology follow-up on Tuesday.  He has no infectious symptoms.  He is able to ambulate by himself.  At this point I think he is stable for discharge      Final Clinical Impression(s) / ED Diagnoses Final diagnoses:  None    Rx / DC Orders ED Discharge Orders     None        Charlynne Pander, MD 03/16/21 1949

## 2021-03-16 NOTE — ED Notes (Signed)
Provider's notified regarding pt c/o generalized weakness, worse on the left, with modified stroke scale score of 2.

## 2021-03-16 NOTE — Discharge Instructions (Addendum)
Please continue taking your baby aspirin  Your MRI today did not show an acute stroke  You need to see the neurologist as scheduled on Tuesday  Return to ER if you have worse weakness, numbness, trouble walking, chest pain, fever

## 2021-09-29 IMAGING — MR MR HEAD W/O CM
10 series · 48 of 48 positions shown · non-contrast
Comparison: MRI 03/01/2021.

CLINICAL DATA: Neurological deficit. Stroke suspected. Speech
disturbance. Left-sided weakness.

EXAM:
MRI HEAD WITHOUT CONTRAST
TECHNIQUE: Multiplanar, multiecho pulse sequences of the brain and surrounding
structures were obtained without intravenous contrast.

[Series 5: DWI · axial · 3.0mm · 1.36mm/px · z∈[-66,+74]mm · 9 of 96 slices shown (1 of 2)]
[im 1/96]
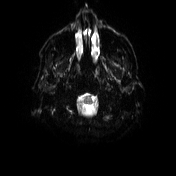
[im 12/96]
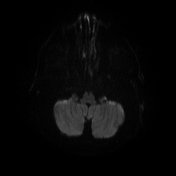
[im 24/96]
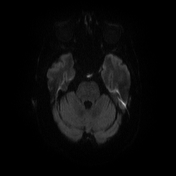
[im 36/96]
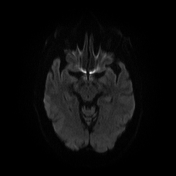
[im 48/96]
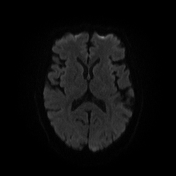
[im 60/96]
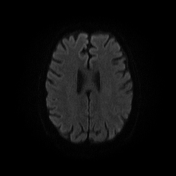
[im 72/96]
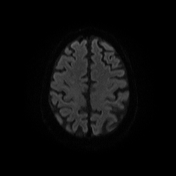
[im 84/96]
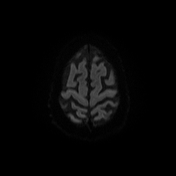
[im 96/96]
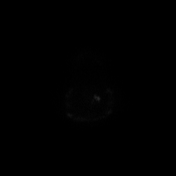

[Series 6: DWI · axial · 3.0mm · 1.36mm/px · z∈[-66,+74]mm · 4 of 48 slices shown (2 of 2)]
[im 1/48]
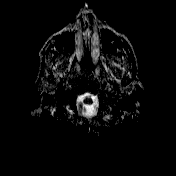
[im 16/48]
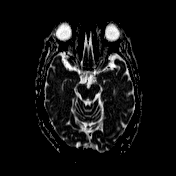
[im 32/48]
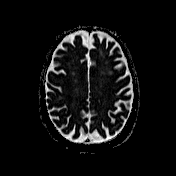
[im 48/48]
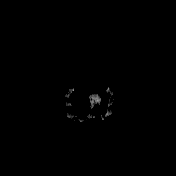

[Series 7: T1 · sagittal · 5.0mm · 0.75mm/px · 2 of 24 slices shown (1 of 2)]
[im 1/24]
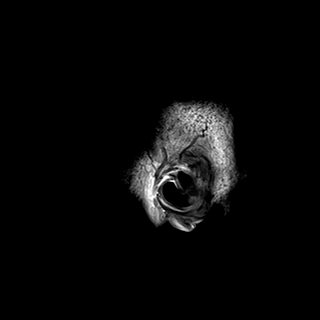
[im 24/24]
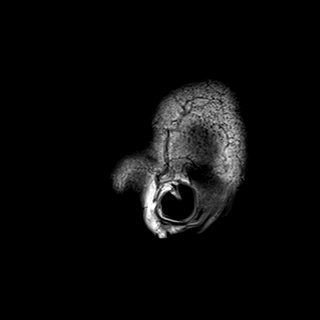

[Series 8: T2 · axial · 5.0mm · 0.62mm/px · z∈[-76,+86]mm · 2 of 26 slices shown (1 of 2)]
[im 1/26]
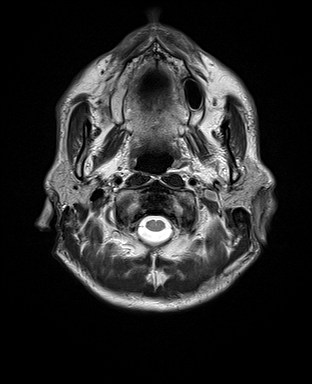
[im 26/26]
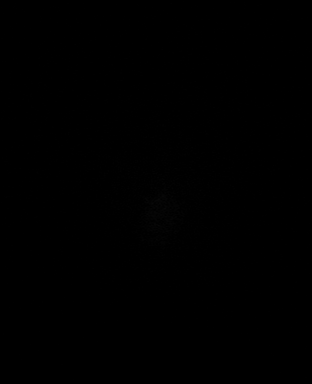

[Series 9: FLAIR · axial · 3.0mm · 0.75mm/px · z∈[-71,+81]mm · 4 of 52 slices shown]
[im 1/52]
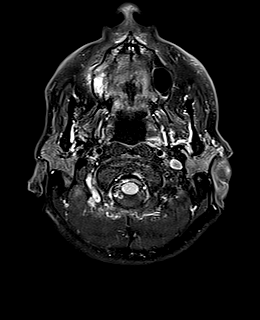
[im 18/52]
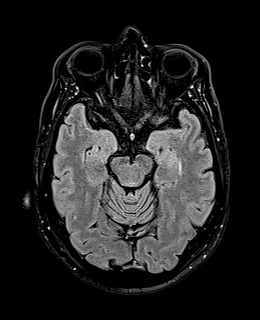
[im 35/52]
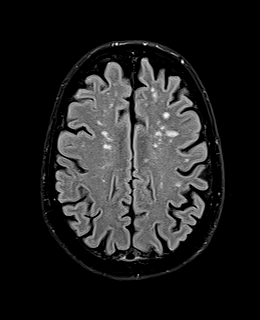
[im 52/52]
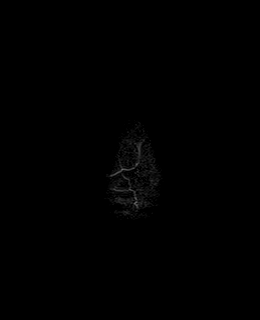

[Series 10: swi_images · axial · 3.0mm · 0.75mm/px · z∈[-77,+87]mm · 5 of 56 slices shown]
[im 1/56]
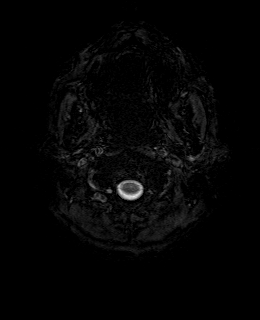
[im 14/56]
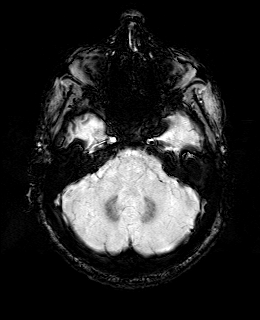
[im 28/56]
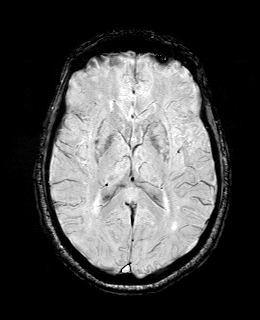
[im 42/56]
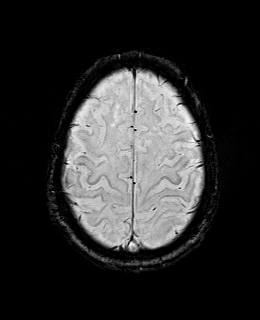
[im 56/56]
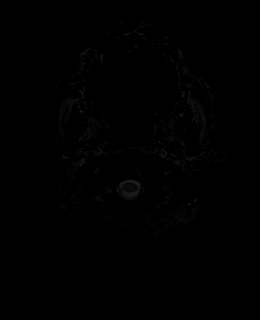

[Series 12: T1 · axial · 1.0mm · 0.94mm/px · z∈[-74,+84]mm · 13 of 160 slices shown (2 of 2)]
[im 1/160]
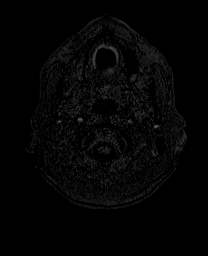
[im 14/160]
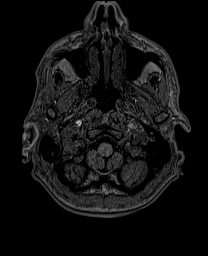
[im 27/160]
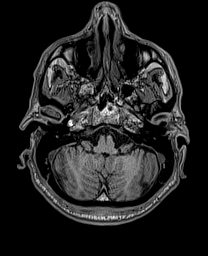
[im 40/160]
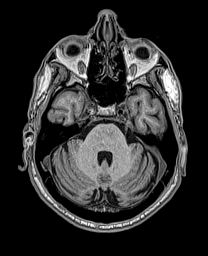
[im 54/160]
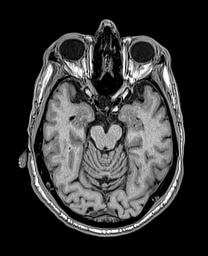
[im 67/160]
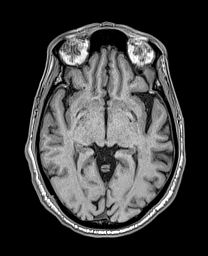
[im 80/160]
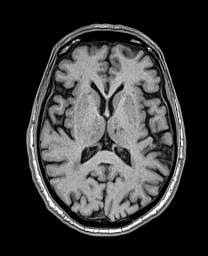
[im 93/160]
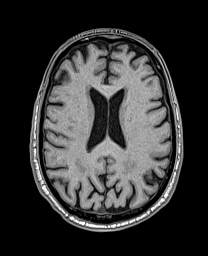
[im 107/160]
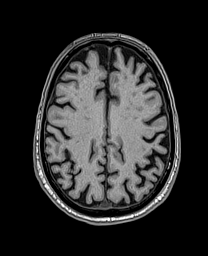
[im 120/160]
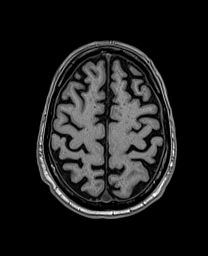
[im 133/160]
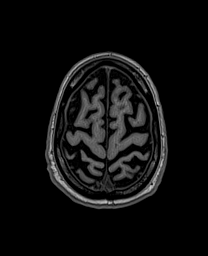
[im 146/160]
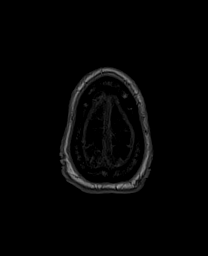
[im 160/160]
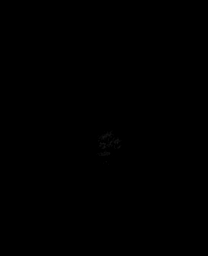

[Series 13: cor dwi_tracew · coronal · 5.0mm · 1.53mm/px · 4 of 52 slices shown]
[im 1/52]
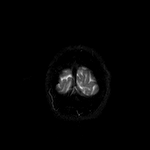
[im 18/52]
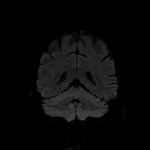
[im 35/52]
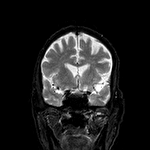
[im 52/52]
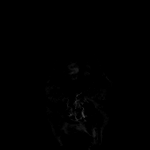

[Series 14: cor dwi_adc · coronal · 5.0mm · 1.53mm/px · 2 of 26 slices shown]
[im 1/26]
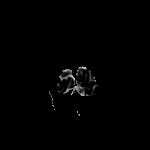
[im 26/26]
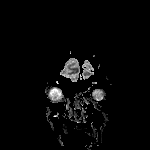

[Series 15: T2 · coronal · 5.0mm · 0.57mm/px · 3 of 38 slices shown (2 of 2)]
[im 1/38]
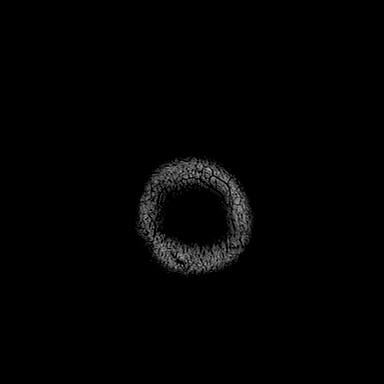
[im 19/38]
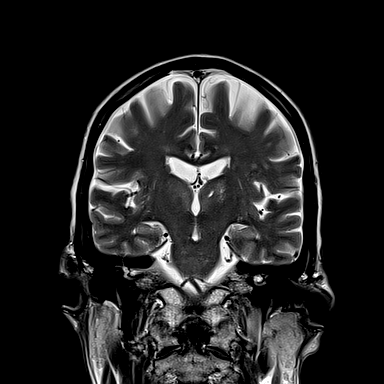
[im 38/38]
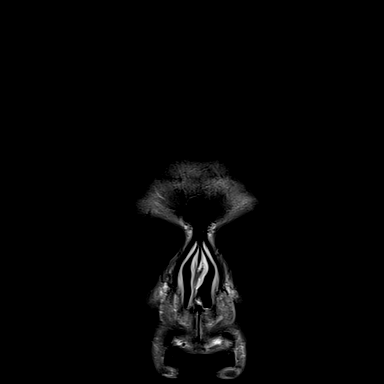

[48 of 48 positions shown; findings below may reference images not displayed]

FINDINGS: Brain: Diffusion imaging does not show any acute or subacute
infarction. Mild chronic small-vessel ischemic changes affect pons.
No focal cerebellar finding. Cerebral hemispheres show old small
vessel infarctions of the thalami and mild to moderate chronic
small-vessel ischemic changes of the cerebral hemispheric white
matter. No cortical or large vessel territory infarction. No mass
lesion, hemorrhage, hydrocephalus or extra-axial collection. No
change since the previous examination.

Vascular: Major vessels at the base of the brain show flow.

Skull and upper cervical spine: Negative

Sinuses/Orbits: Clear/normal

Other: None
IMPRESSION: No change since 03/01/2021. No acute infarction shown by MRI. Mild
to moderate chronic small-vessel ischemic changes of the pons,
thalami and cerebral hemispheric white matter.

## 2024-05-15 ENCOUNTER — Other Ambulatory Visit: Payer: Self-pay

## 2024-05-15 ENCOUNTER — Emergency Department (HOSPITAL_BASED_OUTPATIENT_CLINIC_OR_DEPARTMENT_OTHER)

## 2024-05-15 ENCOUNTER — Encounter (HOSPITAL_BASED_OUTPATIENT_CLINIC_OR_DEPARTMENT_OTHER): Payer: Self-pay | Admitting: Emergency Medicine

## 2024-05-15 ENCOUNTER — Emergency Department (HOSPITAL_BASED_OUTPATIENT_CLINIC_OR_DEPARTMENT_OTHER): Admission: EM | Admit: 2024-05-15 | Discharge: 2024-05-15 | Disposition: A | Discharge status: Home / Self Care

## 2024-05-15 DIAGNOSIS — Z7982 Long term (current) use of aspirin: Secondary | ICD-10-CM | POA: Diagnosis not present

## 2024-05-15 DIAGNOSIS — Z79899 Other long term (current) drug therapy: Secondary | ICD-10-CM | POA: Diagnosis not present

## 2024-05-15 DIAGNOSIS — F0390 Unspecified dementia without behavioral disturbance: Secondary | ICD-10-CM | POA: Diagnosis not present

## 2024-05-15 DIAGNOSIS — K5900 Constipation, unspecified: Secondary | ICD-10-CM | POA: Diagnosis present

## 2024-05-15 DIAGNOSIS — I10 Essential (primary) hypertension: Secondary | ICD-10-CM | POA: Diagnosis not present

## 2024-05-15 LAB — CBC WITH DIFFERENTIAL/PLATELET
Abs Immature Granulocytes: 0.05 K/uL (ref 0.00–0.07)
Basophils Absolute: 0.1 K/uL (ref 0.0–0.1)
Basophils Relative: 1 %
Eosinophils Absolute: 0.3 K/uL (ref 0.0–0.5)
Eosinophils Relative: 3 %
HCT: 42.1 % (ref 39.0–52.0)
Hemoglobin: 13.2 g/dL (ref 13.0–17.0)
Immature Granulocytes: 1 %
Lymphocytes Relative: 32 %
Lymphs Abs: 3.1 K/uL (ref 0.7–4.0)
MCH: 24.7 pg — ABNORMAL LOW (ref 26.0–34.0)
MCHC: 31.4 g/dL (ref 30.0–36.0)
MCV: 78.8 fL — ABNORMAL LOW (ref 80.0–100.0)
Monocytes Absolute: 0.6 K/uL (ref 0.1–1.0)
Monocytes Relative: 6 %
Neutro Abs: 5.8 K/uL (ref 1.7–7.7)
Neutrophils Relative %: 57 %
Platelets: 373 K/uL (ref 150–400)
RBC: 5.34 MIL/uL (ref 4.22–5.81)
RDW: 16.4 % — ABNORMAL HIGH (ref 11.5–15.5)
WBC: 10 K/uL (ref 4.0–10.5)
nRBC: 0 % (ref 0.0–0.2)

## 2024-05-15 LAB — COMPREHENSIVE METABOLIC PANEL WITH GFR
ALT: 12 U/L (ref 0–44)
AST: 23 U/L (ref 15–41)
Albumin: 4.2 g/dL (ref 3.5–5.0)
Alkaline Phosphatase: 116 U/L (ref 38–126)
Anion gap: 11 (ref 5–15)
BUN: 11 mg/dL (ref 8–23)
CO2: 26 mmol/L (ref 22–32)
Calcium: 9.3 mg/dL (ref 8.9–10.3)
Chloride: 105 mmol/L (ref 98–111)
Creatinine, Ser: 1.21 mg/dL (ref 0.61–1.24)
GFR, Estimated: >60 mL/min (ref >60)
Glucose, Bld: 100 mg/dL — ABNORMAL HIGH (ref 70–99)
Potassium: 4 mmol/L (ref 3.5–5.1)
Sodium: 142 mmol/L (ref 135–145)
Total Bilirubin: 0.3 mg/dL (ref 0.0–1.2)
Total Protein: 7.4 g/dL (ref 6.5–8.1)

## 2024-05-15 MED ORDER — IOHEXOL 300 MG/ML  SOLN
100.0000 mL | Freq: Once | INTRAMUSCULAR | Status: AC | PRN
  Administered 2024-05-15: 80 mL via INTRAVENOUS

## 2024-05-15 MED ORDER — MINERAL OIL RE ENEM
1.0000 | ENEMA | Freq: Once | RECTAL | Status: AC
  Administered 2024-05-15: 1 via RECTAL
  Filled 2024-05-15: qty 1

## 2024-05-15 NOTE — Discharge Instructions (Addendum)
 Your CT scan showed that you were experiencing constipation.  No signs of bowel obstruction.    Drink plenty of fluids at home and eat a fiber fillet diet (lots of fruits and vegetables). Please engage in daily exercise as this also helps to maintain regular bowel movements.   Try to use the bathroom after eating a meal. Place your feet up on a small stepstool when trying to have a bowel movement.  Take 3 scoop(s) of Miralax daily until you have a bowel movement. Please take 3 scoops when you get home today. Miralax is available over the counter at any drugstore.   Return to the ER if you still have not had a bowel movement within the next 5 days, you begin vomiting, you no longer are passing gas.  Please follow up with your PCP within the next month to address strategies to prevent constipation in the future.

## 2024-05-15 NOTE — ED Notes (Signed)
 ED Provider at bedside.

## 2024-05-15 NOTE — ED Provider Notes (Signed)
 [place] [facility] Provider Note   CSN: [id number] Arrival date & time: 05/15/24  9046     Patient presents with: Constipation   Mark Bird is a 73 y.o. male with history of hypertension, high cholesterol, CVA, dementia, presents with concern for constipation.  Reports he has not had a bowel movement in the past 6 days.  He is still passing gas.  Reports a pressure sensation in his lower abdomen and he feels like he needs to have a bowel movement, but cannot go.  He took 1 scoop of MiraLAX yesterday without improvement in his symptoms.  Has not tried any other laxatives for his symptoms.  No nausea or vomiting.  No fever or chills.    Constipation      Prior to Admission medications   Medication Sig Start Date End Date Taking? Authorizing Provider  acetaminophen  (TYLENOL ) 325 MG tablet Take 2 tablets (650 mg total) by mouth every 6 (six) hours as needed. 01/26/21   Simaan, Elizabeth S, PA-C  amLODipine  (NORVASC ) 10 MG tablet Take 10 mg by mouth daily. 01/16/21   [provider]  aspirin EC 81 MG tablet Take 81 mg by mouth daily. Swallow whole.    [provider]  diclofenac Sodium (VOLTAREN) 1 % GEL Apply 2 g topically 4 (four) times daily as needed for pain. 01/28/21   [provider]  gabapentin (NEURONTIN) 300 MG capsule Take 300 mg by mouth at bedtime. 04/05/18   [provider]  HYDROcodone -acetaminophen  (NORCO/VICODIN) 5-325 MG tablet Take 1 tablet by mouth every 4 (four) hours as needed. 01/25/21   Rancour, Garnette, MD  methocarbamol  (ROBAXIN ) 500 MG tablet Take 1 tablet (500 mg total) by mouth every 8 (eight) hours as needed (use for muscle cramps/pain). 01/26/21   Simaan, Elizabeth S, PA-C  nortriptyline (PAMELOR) 75 MG capsule Take 75 mg by mouth at bedtime. 11/11/20   [provider]  pravastatin (PRAVACHOL) 40 MG tablet Take 40 mg by mouth at bedtime. 01/15/21   [provider]     Allergies: Patient has no known allergies.    Review of Systems  Gastrointestinal:  Positive for constipation.    Updated Vital Signs BP (!) 143/79 (BP Location: Right Arm)   Pulse (!) 58   Temp 97.6 F (36.4 C) (Oral)   Resp 18   Ht 5' 9 (1.753 m)   Wt 81.2 kg   SpO2 96%   BMI 26.43 kg/m   Physical Exam Vitals and nursing note reviewed.  Constitutional:      General: He is not in acute distress.    Appearance: He is well-developed.  HENT:     Head: Normocephalic and atraumatic.  Eyes:     Conjunctiva/sclera: Conjunctivae normal.  Cardiovascular:     Rate and Rhythm: Normal rate and regular rhythm.     Heart sounds: No murmur heard. Pulmonary:     Effort: Pulmonary effort is normal. No respiratory distress.     Breath sounds: Normal breath sounds.  Abdominal:     Palpations: Abdomen is soft.     Tenderness: There is no abdominal tenderness.  Musculoskeletal:        General: No swelling.     Cervical back: Neck supple.  Skin:    General: Skin is warm and dry.     Capillary Refill: Capillary refill takes less than 2 seconds.  Neurological:     Mental Status: He is alert.  Psychiatric:  Mood and Affect: Mood normal.     (all labs ordered are listed, but only abnormal results are displayed) Labs Reviewed  CBC WITH DIFFERENTIAL/PLATELET - Abnormal; Notable for the following components:      Result Value   MCV 78.8 (*)    MCH 24.7 (*)    RDW 16.4 (*)    All other components within normal limits  COMPREHENSIVE METABOLIC PANEL WITH GFR - Abnormal; Notable for the following components:   Glucose, Bld 100 (*)    All other components within normal limits    EKG: None  Radiology: CT ABDOMEN PELVIS W CONTRAST Result Date: 05/15/2024 CLINICAL DATA:  Patient unable to have bowel movements. EXAM: CT ABDOMEN AND PELVIS WITH CONTRAST TECHNIQUE: Multidetector CT imaging of the abdomen and pelvis was performed using the standard protocol following bolus  administration of intravenous contrast. RADIATION DOSE REDUCTION: This exam was performed according to the departmental dose-optimization program which includes automated exposure control, adjustment of the mA and/or kV according to patient size and/or use of iterative reconstruction technique. CONTRAST:  80mL OMNIPAQUE  IOHEXOL  300 MG/ML  SOLN COMPARISON:  CT abdomen and pelvis dated 04/15/2023 FINDINGS: Lower chest: Bilateral lower lobe subsegmental atelectasis and mild bronchiectasis. No pleural effusion or pneumothorax demonstrated. Partially imaged heart size is normal. Hepatobiliary: No focal hepatic lesions. No intra or extrahepatic biliary ductal dilation. Normal gallbladder. Pancreas: No focal lesions or main ductal dilation. Spleen: Normal in size without focal abnormality. Adrenals/Urinary Tract: 13 mm left adrenal nodule measures 134 HU, unchanged in size from 04/15/2023, likely an adenoma. No specific follow-up imaging recommended. No right adrenal nodule. No suspicious renal mass, calculi or hydronephrosis. Bilateral simple renal cysts. No specific follow-up imaging recommended. No focal bladder wall thickening. Stomach/Bowel: Normal appearance of the stomach. Markedly hyperattenuating intraluminal material throughout the colon, most concentrated in the rectum, where the beam hardening artifact limits evaluation of adjacent anatomy. No evidence of bowel wall thickening, distention, or inflammatory changes. Appendix is not discretely seen. Vascular/Lymphatic: Aortic atherosclerosis. Circumaortic left renal vein. No enlarged abdominal or pelvic lymph nodes. Reproductive: Suboptimal evaluation of pelvic structures due to beam hardening artifact from rectal hyperdensity. Other: No free fluid, fluid collection, or free air. Musculoskeletal: No acute or abnormal lytic or blastic osseous lesions. IMPRESSION: 1. Markedly hyperattenuating intraluminal material throughout the colon, most concentrated in the  rectum, where the beam hardening artifact limits evaluation of adjacent anatomy. Findings are suggestive of constipation, possibly secondary to impaction by contrast material. Recommend correlation with recent history of barium enema or other fluoroscopic examination. 2.  Aortic Atherosclerosis (ICD10-I70.0). Electronically Signed   By: Limin  Xu M.D.   On: 05/15/2024 12:32     Procedures   Medications Ordered in the ED  iohexol  (OMNIPAQUE ) 300 MG/ML solution 100 mL (80 mLs Intravenous Contrast Given 05/15/24 1136)  mineral oil enema 1 enema (1 enema Rectal Given 05/15/24 1302)    Clinical Course as of 05/15/24 1445  Sat May 15, 2024  1243 Had shared decision-making conversation with patient regarding management of his constipation.  He would like to try an enema here today instead of a bowel regimen at home given his discomfort. [AF]    Clinical Course User Index [AF] Veta Palma, PA-C                                 Medical Decision Making Amount and/or Complexity of Data Reviewed Labs: ordered. Radiology: ordered.  Risk OTC drugs. Prescription drug management.     Differential diagnosis includes but is not limited to constipation, SBO, colitis, diverticulitis, malignancy  ED Course:  Upon initial evaluation, patient is very well-appearing, no acute distress.  Mildly elevated blood pressure 157/79 upon arrival, but no other vital sign abnormalities.  Abdomen is soft and nontender on my exam.  Patient declines any pain medicine at this time  Labs Ordered: I Ordered, and personally interpreted labs.  The pertinent results include:   CBC without leukocytosis CMP within normal limits, no elevations in LFTs or creatinine.  Normal electrolytes   Imaging Studies ordered: I ordered imaging studies including CT abdomen and pelvis I independently visualized the imaging with scope of interpretation limited to determining acute life threatening conditions related to  emergency care. Imaging showed   IMPRESSION:  1. Markedly hyperattenuating intraluminal material throughout the  colon, most concentrated in the rectum, where the beam hardening  artifact limits evaluation of adjacent anatomy. Findings are  suggestive of constipation, possibly secondary to impaction by  contrast material. Recommend correlation with recent history of  barium enema or other fluoroscopic examination.  2.  Aortic Atherosclerosis (ICD10-I70.0).   I agree with the radiologist interpretation   Medications Given: Enema  Upon re-evaluation, patient mains well-appearing with stable vitals.  His CT scan shows findings suggestive of constipation.  No signs of small bowel obstruction or other acute intra-abdominal pathology. Material in the lumen of the intestine is hyperattenuating, and this corresponds with patient having a recent barium swallow study on 05/10/2024 and he states he has not had a bowel movement since this date. He did not receive any oral contrast for CT scan here today.  His labs are reassuring.  No leukocytosis on CBC.  CMP within normal limits. I discussed findings with patient.  He would like to try an enema here today for management of his constipation over bowel regimen at home due to his discomfort. 2:44 PM.  Upon reevaluation, patient has had a small BM after the enema here.  Discussed that we can continue at home with home bowel regimen of MiraLAX to begin with.  He stable and appropriate for discharge home.    Impression: Constipation  Disposition:  The patient was discharged home with instructions to drink plenty water and maintain a fiber fluid diet.  May use 3 scoops MiraLAX daily until he has a complete bowel movement, then can decrease to 1 scoop a day.  Follow-up with his PCP within the next month for further management of constipation. Return precautions given.    Record Review: External records from outside source obtained and reviewed including   FL ESOPHAGRAM performed on 05/10/2024 at [facility] for evaluation of his dysphagia.     This chart was dictated using voice recognition software, Dragon. Despite the best efforts of this provider to proofread and correct errors, errors may still occur which can change documentation meaning.       Final diagnoses:  Constipation, unspecified constipation type    ED Discharge Orders     None          Veta Palma, PA-C 05/15/24 1445    Neysa Caron PARAS, DO 05/15/24 1524

## 2024-05-15 NOTE — ED Triage Notes (Signed)
 Pt states unable to have bowel movement since yesterday, has tried OTC meds. States no pain but has pressure.

## 2024-05-15 NOTE — ED Notes (Signed)
 Pt states has small results from enema, understands all d/c instructions

## 2024-05-15 NOTE — ED Notes (Signed)
 Pt up to [facility]
